# Patient Record
Sex: Male | Born: 1965 | Race: Black or African American | Hispanic: No | Marital: Single | Smoking: Current every day smoker
Health system: Southern US, Community
[De-identification: ages and names within clinical notes are randomized; demographics above are authoritative.]

## PROBLEM LIST (undated history)

## (undated) DIAGNOSIS — J45909 Unspecified asthma, uncomplicated: Secondary | ICD-10-CM

## (undated) DIAGNOSIS — I10 Essential (primary) hypertension: Secondary | ICD-10-CM

---

## 2011-01-30 ENCOUNTER — Emergency Department (HOSPITAL_COMMUNITY)
Admission: EM | Admit: 2011-01-30 | Discharge: 2011-01-31 | Discharge status: Against Medical Advice | Payer: Self-pay | Attending: Emergency Medicine | Admitting: Emergency Medicine

## 2011-01-30 DIAGNOSIS — M545 Low back pain, unspecified: Secondary | ICD-10-CM | POA: Insufficient documentation

## 2011-01-30 DIAGNOSIS — M79609 Pain in unspecified limb: Secondary | ICD-10-CM | POA: Insufficient documentation

## 2011-02-15 ENCOUNTER — Observation Stay (HOSPITAL_COMMUNITY)
Admission: EM | Admit: 2011-02-15 | Discharge: 2011-02-16 | Disposition: A | Discharge status: Home / Self Care | Payer: Self-pay | Attending: General Surgery | Admitting: General Surgery

## 2011-02-15 ENCOUNTER — Emergency Department (HOSPITAL_COMMUNITY): Payer: Self-pay

## 2011-02-15 DIAGNOSIS — Z23 Encounter for immunization: Secondary | ICD-10-CM | POA: Insufficient documentation

## 2011-02-15 DIAGNOSIS — F17200 Nicotine dependence, unspecified, uncomplicated: Secondary | ICD-10-CM | POA: Insufficient documentation

## 2011-02-15 DIAGNOSIS — I10 Essential (primary) hypertension: Secondary | ICD-10-CM | POA: Insufficient documentation

## 2011-02-15 DIAGNOSIS — L089 Local infection of the skin and subcutaneous tissue, unspecified: Secondary | ICD-10-CM | POA: Insufficient documentation

## 2011-02-15 DIAGNOSIS — M65839 Other synovitis and tenosynovitis, unspecified forearm: Principal | ICD-10-CM | POA: Insufficient documentation

## 2011-02-15 DIAGNOSIS — Y998 Other external cause status: Secondary | ICD-10-CM | POA: Insufficient documentation

## 2011-02-15 DIAGNOSIS — M65849 Other synovitis and tenosynovitis, unspecified hand: Secondary | ICD-10-CM | POA: Insufficient documentation

## 2011-02-15 DIAGNOSIS — E119 Type 2 diabetes mellitus without complications: Secondary | ICD-10-CM | POA: Insufficient documentation

## 2011-02-15 DIAGNOSIS — W268XXA Contact with other sharp object(s), not elsewhere classified, initial encounter: Secondary | ICD-10-CM | POA: Insufficient documentation

## 2011-02-15 DIAGNOSIS — F1210 Cannabis abuse, uncomplicated: Secondary | ICD-10-CM | POA: Insufficient documentation

## 2011-02-15 LAB — COMPREHENSIVE METABOLIC PANEL
ALT: 16 U/L (ref 0–53)
AST: 19 U/L (ref 0–37)
Albumin: 3.7 g/dL (ref 3.5–5.2)
Alkaline Phosphatase: 102 U/L (ref 39–117)
BUN: 11 mg/dL (ref 6–23)
CO2: 25 mEq/L (ref 19–32)
Calcium: 8.8 mg/dL (ref 8.4–10.5)
Chloride: 112 mEq/L (ref 96–112)
Creatinine, Ser: 0.91 mg/dL (ref 0.4–1.5)
GFR calc Af Amer: >60 mL/min (ref >60)
GFR calc non Af Amer: >60 mL/min (ref >60)
Glucose, Bld: 166 mg/dL — ABNORMAL HIGH (ref 70–99)
Potassium: 3.2 mEq/L — ABNORMAL LOW (ref 3.5–5.1)
Sodium: 142 mEq/L (ref 135–145)
Total Bilirubin: 0.9 mg/dL (ref 0.3–1.2)
Total Protein: 6.4 g/dL (ref 6.0–8.3)

## 2011-02-15 LAB — DIFFERENTIAL
Basophils Absolute: 0 10*3/uL (ref 0.0–0.1)
Basophils Relative: 0 % (ref 0–1)
Eosinophils Absolute: 0.1 10*3/uL (ref 0.0–0.7)
Eosinophils Relative: 1 % (ref 0–5)
Lymphocytes Relative: 30 % (ref 12–46)
Lymphs Abs: 3.5 10*3/uL (ref 0.7–4.0)
Monocytes Absolute: 0.8 10*3/uL (ref 0.1–1.0)
Monocytes Relative: 7 % (ref 3–12)
Neutro Abs: 7.4 10*3/uL (ref 1.7–7.7)
Neutrophils Relative %: 62 % (ref 43–77)

## 2011-02-15 LAB — CBC
HCT: 39 % (ref 39.0–52.0)
Hemoglobin: 14.2 g/dL (ref 13.0–17.0)
MCH: 31.6 pg (ref 26.0–34.0)
MCHC: 36.4 g/dL — ABNORMAL HIGH (ref 30.0–36.0)
MCV: 86.7 fL (ref 78.0–100.0)
Platelets: 255 10*3/uL (ref 150–400)
RBC: 4.5 MIL/uL (ref 4.22–5.81)
RDW: 13.6 % (ref 11.5–15.5)
WBC: 11.8 10*3/uL — ABNORMAL HIGH (ref 4.0–10.5)

## 2011-02-18 LAB — WOUND CULTURE
Culture: NO GROWTH
Gram Stain: NONE SEEN

## 2011-02-23 NOTE — Op Note (Signed)
NAME:  Daniel Briggs, Daniel Briggs NO.:  [id number]  MEDICAL RECORD NO.:  [id number]           PATIENT TYPE:  LOCATION:                                 FACILITY:  PHYSICIAN:  Johnette Abraham, MD    DATE OF BIRTH:  03-Jun-1966  DATE OF PROCEDURE:  02/16/2011 DATE OF DISCHARGE:                              OPERATIVE REPORT   PREOPERATIVE DIAGNOSIS:  Infected left index finger.  POSTOPERATIVE DIAGNOSIS:  Infected left index finger.  PROCEDURE:  Incision and drainage of complex infection of the left index finger, drainage of the tendon sheath, release of the A1 pulley.  INDICATIONS:  Daniel Briggs is a 45 year old black male who comes to the emergency room with a red, swollen, painful left index finger.  He states that approximately a week he got a splinter in his finger.  He proceeded to remove the splinter with his pocket knife, and over the past week it has become increasingly painful, swollen, signs and symptoms of infection.  He presented to the emergency room.  On evaluation, he had a red, hot, swollen finger with obvious purulence underneath the skin on the ulnar aspect of the index finger with evidence of tenosynovitis.  Risks, benefits, and alternatives of surgery were thoroughly discussed with this gentleman including loss of the finger and further spread of the infection into the hand.  Consent was obtained for surgery.  PROCEDURE:  The patient was taken to the operating room, placed supine on the operating room table.  Preoperative antibiotics were given in the emergency room.  A time-out was performed.  General anesthesia was administered without difficulty.  The left upper extremity was prepped and draped in the normal sterile fashion.  The arm was elevated for approximately a minute and then the tourniquet was inflated to 250 mmHg. Initially, a transverse incision was made over the A1 pulley of the index finger.  Dissection was carried down to the A1 pulley  and tendon. Tendon sheath was opened.  There was not any gross purulence from the tendon sheath, just a synovial-type fluid.  This was sent for culture. An incision was made over the distal insertion of the flexor tendon of the index finger.  Again, the tendon sheath was opened.  There was no gross purulence.  A 22-gauge Angiocath was used in a proximal to distal fashion and the sheath was irrigated with saline solution.  Afterwards, each of these incisions was partially closed and a small piece of Iodoform gauze was packed in these incisions.  Following the area of fluctuance and purulence was addressed, incision overlying this area was performed and immediately a large amount of pus was obtained.  Nonviable skin was removed from the surrounding area revealing a 0.5-cm sized hole on the ulnar aspect of his finger.  Thorough irrigation was performed. The incision tracked up underneath the skin back to the webspace.  This was irrigated with approximately a liter of normal saline solution. Afterwards, the tourniquet was released.  Hemostasis was achieved with direct pressure.  This wound was then packed with quarter- inch Iodoform gauze.  Xeroform was placed over all the  wounds and a sterile dressing.  Marcaine 0.5% was used in the tendon sheath incision and proximally for a median nerve block for comfort.  The patient will have wound care to be performed.  He will be placed on antibiotics and this infection followed carefully.     Johnette Abraham, MD     HCC/MEDQ  D:  02/16/2011  T:  02/16/2011  Job:  [id number]  Electronically Signed by Knute Neu MD on 02/23/2011 10:56:54 PM

## 2011-02-27 NOTE — Discharge Summary (Signed)
  NAME:  Daniel Briggs, Daniel Briggs NO.:  [id number]  MEDICAL RECORD NO.:  [id number]           PATIENT TYPE:  O  LOCATION:  5005                         FACILITY:  [facility]  PHYSICIAN:  Johnette Abraham, MD    DATE OF BIRTH:  Aug 28, 1966  DATE OF ADMISSION:  02/15/2011 DATE OF DISCHARGE:  02/16/2011                              DISCHARGE SUMMARY   ADMITTING DIAGNOSIS:  Infected left index finger.  POSTOPERATIVE DIAGNOSIS:  Infected left index finger.  PROCEDURES PERFORMED:  Incision and drainage of complex infection of the left index finger, drainage of the tendon sheath, and release of the A1 pulley.  Please see operative note for details.  HOSPITAL COURSE:  This patient was seen in the emergency room and felt that he needed surgery.  Consent was obtained.  Surgery was performed. The patient was sent home postsurgery.  INSTRUCTIONS:  He is to remove his packing on postoperative day #1.  He is to irrigate the wounds with normal saline solution and recover them. He has been placed on antibiotics and given a prescription for pain medicines.  His diet is as tolerated.  His activities is just to keep the finger clean and covered.  I will see him back in my office next week.     Johnette Abraham, MD     HCC/MEDQ  D:  02/26/2011  T:  02/26/2011  Job:  [id number]  Electronically Signed by Knute Neu MD on 02/27/2011 08:44:41 AM

## 2011-03-21 ENCOUNTER — Emergency Department (HOSPITAL_COMMUNITY): Payer: Self-pay

## 2011-03-21 ENCOUNTER — Encounter (HOSPITAL_COMMUNITY): Payer: Self-pay | Admitting: Radiology

## 2011-03-21 ENCOUNTER — Emergency Department (HOSPITAL_COMMUNITY)
Admission: EM | Admit: 2011-03-21 | Discharge: 2011-03-21 | Disposition: A | Discharge status: Home / Self Care | Payer: Self-pay | Attending: Emergency Medicine | Admitting: Emergency Medicine

## 2011-03-21 DIAGNOSIS — E119 Type 2 diabetes mellitus without complications: Secondary | ICD-10-CM | POA: Insufficient documentation

## 2011-03-21 DIAGNOSIS — I10 Essential (primary) hypertension: Secondary | ICD-10-CM | POA: Insufficient documentation

## 2011-03-21 DIAGNOSIS — K5900 Constipation, unspecified: Secondary | ICD-10-CM | POA: Insufficient documentation

## 2011-03-21 DIAGNOSIS — N390 Urinary tract infection, site not specified: Secondary | ICD-10-CM | POA: Insufficient documentation

## 2011-03-21 DIAGNOSIS — R109 Unspecified abdominal pain: Secondary | ICD-10-CM | POA: Insufficient documentation

## 2011-03-21 HISTORY — DX: Essential (primary) hypertension: I10

## 2011-03-21 LAB — COMPREHENSIVE METABOLIC PANEL
ALT: 12 U/L (ref 0–53)
AST: 13 U/L (ref 0–37)
Albumin: 3.7 g/dL (ref 3.5–5.2)
Alkaline Phosphatase: 112 U/L (ref 39–117)
BUN: 8 mg/dL (ref 6–23)
CO2: 29 mEq/L (ref 19–32)
Calcium: 9.3 mg/dL (ref 8.4–10.5)
Chloride: 106 mEq/L (ref 96–112)
Creatinine, Ser: 1.04 mg/dL (ref 0.4–1.5)
GFR calc Af Amer: >60 mL/min (ref >60)
GFR calc non Af Amer: >60 mL/min (ref >60)
Glucose, Bld: 110 mg/dL — ABNORMAL HIGH (ref 70–99)
Potassium: 3.3 mEq/L — ABNORMAL LOW (ref 3.5–5.1)
Sodium: 142 mEq/L (ref 135–145)
Total Bilirubin: 0.6 mg/dL (ref 0.3–1.2)
Total Protein: 6.6 g/dL (ref 6.0–8.3)

## 2011-03-21 LAB — URINALYSIS, ROUTINE W REFLEX MICROSCOPIC
Bilirubin Urine: NEGATIVE
Glucose, UA: NEGATIVE mg/dL
Ketones, ur: NEGATIVE mg/dL
Nitrite: NEGATIVE
Protein, ur: NEGATIVE mg/dL
Specific Gravity, Urine: 1.005 (ref 1.005–1.030)
Urobilinogen, UA: 0.2 mg/dL (ref 0.0–1.0)
pH: 6.5 (ref 5.0–8.0)

## 2011-03-21 LAB — ABO/RH: ABO/RH(D): B POS

## 2011-03-21 LAB — DIFFERENTIAL
Basophils Absolute: 0 10*3/uL (ref 0.0–0.1)
Basophils Relative: 0 % (ref 0–1)
Eosinophils Absolute: 0 10*3/uL (ref 0.0–0.7)
Eosinophils Relative: 0 % (ref 0–5)
Lymphocytes Relative: 21 % (ref 12–46)
Lymphs Abs: 2.9 10*3/uL (ref 0.7–4.0)
Monocytes Absolute: 1.1 10*3/uL — ABNORMAL HIGH (ref 0.1–1.0)
Monocytes Relative: 8 % (ref 3–12)
Neutro Abs: 9.6 10*3/uL — ABNORMAL HIGH (ref 1.7–7.7)
Neutrophils Relative %: 71 % (ref 43–77)

## 2011-03-21 LAB — URINE MICROSCOPIC-ADD ON

## 2011-03-21 LAB — TYPE AND SCREEN
ABO/RH(D): B POS
Antibody Screen: NEGATIVE

## 2011-03-21 LAB — CBC
HCT: 39.1 % (ref 39.0–52.0)
Hemoglobin: 14.6 g/dL (ref 13.0–17.0)
MCH: 32.4 pg (ref 26.0–34.0)
MCHC: 37.3 g/dL — ABNORMAL HIGH (ref 30.0–36.0)
MCV: 86.7 fL (ref 78.0–100.0)
Platelets: 199 10*3/uL (ref 150–400)
RBC: 4.51 MIL/uL (ref 4.22–5.81)
RDW: 13.6 % (ref 11.5–15.5)
WBC: 13.6 10*3/uL — ABNORMAL HIGH (ref 4.0–10.5)

## 2011-03-21 LAB — APTT: aPTT: 30 seconds (ref 24–37)

## 2011-03-21 LAB — PROTIME-INR
INR: 1.09 (ref 0.00–1.49)
Prothrombin Time: 14.3 seconds (ref 11.6–15.2)

## 2011-03-21 LAB — LIPASE, BLOOD: Lipase: 24 U/L (ref 11–59)

## 2011-03-21 MED ORDER — IOHEXOL 300 MG/ML  SOLN: 100.0000 mL | Freq: Once | INTRAMUSCULAR | Status: DC | PRN

## 2011-03-22 LAB — URINE CULTURE
Colony Count: 6000
Culture  Setup Time: 201205161005

## 2011-10-20 ENCOUNTER — Encounter (HOSPITAL_COMMUNITY): Payer: Self-pay

## 2011-10-20 ENCOUNTER — Emergency Department (HOSPITAL_COMMUNITY)
Admission: EM | Admit: 2011-10-20 | Discharge: 2011-10-20 | Disposition: A | Discharge status: Home / Self Care | Payer: Self-pay | Attending: Emergency Medicine | Admitting: Emergency Medicine

## 2011-10-20 DIAGNOSIS — S61209A Unspecified open wound of unspecified finger without damage to nail, initial encounter: Secondary | ICD-10-CM | POA: Insufficient documentation

## 2011-10-20 DIAGNOSIS — W260XXA Contact with knife, initial encounter: Secondary | ICD-10-CM | POA: Insufficient documentation

## 2011-10-20 DIAGNOSIS — I10 Essential (primary) hypertension: Secondary | ICD-10-CM | POA: Insufficient documentation

## 2011-10-20 DIAGNOSIS — W261XXA Contact with sword or dagger, initial encounter: Secondary | ICD-10-CM | POA: Insufficient documentation

## 2011-10-20 DIAGNOSIS — S61019A Laceration without foreign body of unspecified thumb without damage to nail, initial encounter: Secondary | ICD-10-CM

## 2011-10-20 MED ORDER — HYDROCODONE-ACETAMINOPHEN 5-325 MG PO TABS: 2.0000 | ORAL_TABLET | ORAL | Status: AC | PRN

## 2011-10-20 NOTE — ED Notes (Signed)
Pt. Sliced his rt. Thumb tip with a knife and also his 3rd middle finger.

## 2011-10-20 NOTE — ED Notes (Signed)
MD at bedside. 

## 2011-10-20 NOTE — ED Notes (Signed)
Patients would was cleaned with betadine, ointment was applied and gauze dressing.  Pt tolerated well.

## 2011-10-20 NOTE — ED Provider Notes (Signed)
History     CSN: [id number] Arrival date & time: 10/20/2011 11:07 AM   First MD Initiated Contact with Patient 10/20/11 1119      Chief Complaint  Patient presents with  . Laceration    (Consider location/radiation/quality/duration/timing/severity/associated sxs/prior treatment) HPI Pt. Sliced his rt. Thumb tip with a knife and also his 3rd middle finger.  Patient is up-to-date on his tetanus.  No other complaints.  Past Medical History  Diagnosis Date  . Hypertension     History reviewed. No pertinent past surgical history.  History reviewed. No pertinent family history.  History  Substance Use Topics  . Smoking status: Current Everyday Smoker  . Smokeless tobacco: Not on file  . Alcohol Use: Yes      Review of Systems  All other systems reviewed and are negative.    Allergies  Penicillins  Home Medications  No current outpatient prescriptions on file.  BP 173/103  Pulse 79  Temp(Src) 98.2 F (36.8 C) (Oral)  Resp 20  Ht 5\' 9"  (1.753 m)  Wt 217 lb (98.431 kg)  BMI 32.05 kg/m2  SpO2 97%  Physical Exam  Nursing note and vitals reviewed. Constitutional: He is oriented to person, place, and time. He appears well-developed and well-nourished. No distress.  HENT:  Head: Normocephalic and atraumatic.  Eyes: Pupils are equal, round, and reactive to light.  Neck: Normal range of motion.  Cardiovascular: Normal rate and intact distal pulses.   Pulmonary/Chest: No respiratory distress.  Abdominal: Normal appearance. He exhibits no distension.  Musculoskeletal: Normal range of motion.       Hands: Neurological: He is alert and oriented to person, place, and time. No cranial nerve deficit.  Skin: Skin is warm and dry. No rash noted.  Psychiatric: He has a normal mood and affect. His behavior is normal.    ED Course  Procedures (including critical care time)  Labs Reviewed - No data to display No results found.   1. Thumb laceration       MDM            Nelia Shi, MD 10/20/11 1136

## 2012-05-15 ENCOUNTER — Encounter (HOSPITAL_COMMUNITY): Payer: Self-pay | Admitting: *Deleted

## 2012-05-15 ENCOUNTER — Emergency Department (HOSPITAL_COMMUNITY): Payer: Self-pay

## 2012-05-15 ENCOUNTER — Emergency Department (HOSPITAL_COMMUNITY)
Admission: EM | Admit: 2012-05-15 | Discharge: 2012-05-16 | Disposition: A | Discharge status: Home / Self Care | Payer: Self-pay | Attending: Emergency Medicine | Admitting: Emergency Medicine

## 2012-05-15 DIAGNOSIS — R319 Hematuria, unspecified: Secondary | ICD-10-CM | POA: Insufficient documentation

## 2012-05-15 DIAGNOSIS — N390 Urinary tract infection, site not specified: Secondary | ICD-10-CM | POA: Insufficient documentation

## 2012-05-15 DIAGNOSIS — F17200 Nicotine dependence, unspecified, uncomplicated: Secondary | ICD-10-CM | POA: Insufficient documentation

## 2012-05-15 DIAGNOSIS — R300 Dysuria: Secondary | ICD-10-CM | POA: Insufficient documentation

## 2012-05-15 DIAGNOSIS — R350 Frequency of micturition: Secondary | ICD-10-CM | POA: Insufficient documentation

## 2012-05-15 DIAGNOSIS — R109 Unspecified abdominal pain: Secondary | ICD-10-CM | POA: Insufficient documentation

## 2012-05-15 DIAGNOSIS — I10 Essential (primary) hypertension: Secondary | ICD-10-CM | POA: Insufficient documentation

## 2012-05-15 DIAGNOSIS — M545 Low back pain, unspecified: Secondary | ICD-10-CM | POA: Insufficient documentation

## 2012-05-15 LAB — URINE MICROSCOPIC-ADD ON

## 2012-05-15 LAB — URINALYSIS, ROUTINE W REFLEX MICROSCOPIC
Bilirubin Urine: NEGATIVE
Glucose, UA: NEGATIVE mg/dL
Ketones, ur: NEGATIVE mg/dL
Nitrite: NEGATIVE
Protein, ur: NEGATIVE mg/dL
Specific Gravity, Urine: 1.024 (ref 1.005–1.030)
Urobilinogen, UA: 1 mg/dL (ref 0.0–1.0)
pH: 7 (ref 5.0–8.0)

## 2012-05-15 LAB — OCCULT BLOOD, POC DEVICE: Fecal Occult Bld: NEGATIVE

## 2012-05-15 MED ORDER — MORPHINE SULFATE 2 MG/ML IJ SOLN
2.0000 mg | Freq: Once | INTRAMUSCULAR | Status: AC
  Administered 2012-05-15: 2 mg via INTRAVENOUS
  Filled 2012-05-15: qty 1

## 2012-05-15 MED ORDER — CIPROFLOXACIN HCL 500 MG PO TABS: 500.0000 mg | ORAL_TABLET | Freq: Two times a day (BID) | ORAL | Status: AC

## 2012-05-15 MED ORDER — KETOROLAC TROMETHAMINE 30 MG/ML IJ SOLN
30.0000 mg | Freq: Once | INTRAMUSCULAR | Status: AC
  Administered 2012-05-15: 30 mg via INTRAVENOUS
  Filled 2012-05-15: qty 1

## 2012-05-15 NOTE — ED Provider Notes (Signed)
History     CSN: [id number]  Arrival date & time 05/15/12  9629   First MD Initiated Contact with Patient 05/15/12 2109      Chief Complaint  Patient presents with  . Flank Pain    R flank pain    (Consider location/radiation/quality/duration/timing/severity/associated sxs/prior treatment) Patient is a 46 y.o. male presenting with flank pain. The history is provided by the patient.  Flank Pain This is a new problem. The current episode started in the past 7 days. The problem occurs constantly. The problem has been unchanged. Pertinent negatives include no abdominal pain, chest pain, congestion, coughing, fatigue, fever, headaches, nausea, neck pain, numbness, rash, sore throat, vomiting or weakness. Exacerbated by: urinating. He has tried nothing for the symptoms. The treatment provided no relief.    Past Medical History  Diagnosis Date  . Hypertension     History reviewed. No pertinent past surgical history.  History reviewed. No pertinent family history.  History  Substance Use Topics  . Smoking status: Current Everyday Smoker  . Smokeless tobacco: Not on file  . Alcohol Use: Yes      Review of Systems  Constitutional: Negative for fever, activity change, appetite change and fatigue.  HENT: Negative for congestion, sore throat, facial swelling, rhinorrhea, trouble swallowing, neck pain, neck stiffness, voice change and sinus pressure.   Eyes: Negative.   Respiratory: Negative for cough, choking, chest tightness, shortness of breath and wheezing.   Cardiovascular: Negative for chest pain.  Gastrointestinal: Negative for nausea, vomiting and abdominal pain.  Genitourinary: Positive for frequency and flank pain. Negative for dysuria, urgency, hematuria and difficulty urinating.  Musculoskeletal: Negative for back pain and gait problem.  Skin: Negative for rash and wound.  Neurological: Negative for facial asymmetry, weakness, numbness and headaches.    Psychiatric/Behavioral: Negative for behavioral problems, confusion and agitation. The patient is not nervous/anxious and is not hyperactive.   All other systems reviewed and are negative.    Allergies  Penicillins  Home Medications  No current outpatient prescriptions on file.  BP 168/102  Pulse 64  Temp 98.7 F (37.1 C) (Oral)  Resp 14  SpO2 96%  Physical Exam  Nursing note and vitals reviewed. Constitutional: He is oriented to person, place, and time. He appears well-developed and well-nourished. No distress.  HENT:  Head: Normocephalic and atraumatic.  Right Ear: External ear normal.  Left Ear: External ear normal.  Mouth/Throat: No oropharyngeal exudate.  Eyes: Conjunctivae and EOM are normal. Pupils are equal, round, and reactive to light. Right eye exhibits no discharge. Left eye exhibits no discharge.  Neck: Normal range of motion. Neck supple. No JVD present. No tracheal deviation present. No thyromegaly present.  Cardiovascular: Normal rate, regular rhythm, normal heart sounds and intact distal pulses.  Exam reveals no gallop and no friction rub.   No murmur heard. Pulmonary/Chest: Effort normal and breath sounds normal. No respiratory distress. He has no wheezes. He exhibits no tenderness.  Abdominal: Soft. Bowel sounds are normal. He exhibits no distension. There is no tenderness. There is CVA tenderness. There is no rebound and no guarding.  Genitourinary: Rectum normal and prostate normal. Guaiac negative stool. No penile tenderness.  Musculoskeletal: Normal range of motion. He exhibits no edema and no tenderness.  Lymphadenopathy:    He has no cervical adenopathy.  Neurological: He is alert and oriented to person, place, and time. No cranial nerve deficit.  Skin: Skin is warm and dry. No rash noted. He is not diaphoretic. No pallor.  Psychiatric: He has a normal mood and affect. His behavior is normal.    ED Course  Procedures (including critical care  time)  Labs Reviewed  URINALYSIS, ROUTINE W REFLEX MICROSCOPIC - Abnormal; Notable for the following:    Hgb urine dipstick TRACE (*)     Leukocytes, UA LARGE (*)     All other components within normal limits  URINE MICROSCOPIC-ADD ON - Abnormal; Notable for the following:    Squamous Epithelial / LPF FEW (*)     All other components within normal limits  OCCULT BLOOD, POC DEVICE   No results found.   No diagnosis found.    MDM  46 year old male patient with past medical history of pyelonephritis and cystitis presents with right flank pain. Patient says the pain has been there for one week and similar to previous urinary tract infection symptoms. Pain is in the right flank L. right lower lumbar region. Worse with straight leg raise. Worse with palpation. Patient with urinary frequency dysuria. Patient with benign abdominal exam. Patient with normal feeling prostate no bogginess no pain with prostate palpation Hemoccult-negative. Patient likely has urinary tract infection followed by this. There is some blood in the patient's urine so there could be concern for stone patient will have noncontrasted CT scan to evaluate  Results for orders placed during the hospital encounter of 05/15/12  URINALYSIS, ROUTINE W REFLEX MICROSCOPIC      Component Value Range   Color, Urine YELLOW  YELLOW   APPearance CLEAR  CLEAR   Specific Gravity, Urine 1.024  1.005 - 1.030   pH 7.0  5.0 - 8.0   Glucose, UA NEGATIVE  NEGATIVE mg/dL   Hgb urine dipstick TRACE (*) NEGATIVE   Bilirubin Urine NEGATIVE  NEGATIVE   Ketones, ur NEGATIVE  NEGATIVE mg/dL   Protein, ur NEGATIVE  NEGATIVE mg/dL   Urobilinogen, UA 1.0  0.0 - 1.0 mg/dL   Nitrite NEGATIVE  NEGATIVE   Leukocytes, UA LARGE (*) NEGATIVE  URINE MICROSCOPIC-ADD ON      Component Value Range   Squamous Epithelial / LPF FEW (*) RARE   WBC, UA 21-50  <3 WBC/hpf   RBC / HPF 0-2  <3 RBC/hpf   Bacteria, UA RARE  RARE  OCCULT BLOOD, POC DEVICE       Component Value Range   Fecal Occult Bld NEGATIVE     Labs consistent with urinary tract infection. Patient will be given 2 weeks of Cipro. The patient is stable for discharge at this time.  Case discussed with Dr. Lyndee Leo, MD 05/16/12 [phone]

## 2012-05-15 NOTE — ED Notes (Signed)
Pt with hx of UTI to ED c/o the same pain - R flank pain and urge to urinate.

## 2012-05-16 NOTE — ED Provider Notes (Signed)
I saw and evaluated the patient, reviewed the resident's note and I agree with the findings and plan.  1 week of constat R lower back pain with urinary symptoms. Denies trauma. No focal weakness, numbness, tingling, incontinence. +2 DP and PT pulses, great toe dorsiflexion intact bilatereally. Prostate and testicles normal on resident's exam.  Glynn Octave, MD 05/16/12 1024

## 2012-05-18 LAB — URINE CULTURE: Colony Count: 15000

## 2012-08-07 ENCOUNTER — Emergency Department (HOSPITAL_COMMUNITY)
Admission: EM | Admit: 2012-08-07 | Discharge: 2012-08-07 | Disposition: A | Discharge status: Home / Self Care | Payer: Self-pay | Attending: Emergency Medicine | Admitting: Emergency Medicine

## 2012-08-07 ENCOUNTER — Encounter (HOSPITAL_COMMUNITY): Payer: Self-pay | Admitting: Emergency Medicine

## 2012-08-07 DIAGNOSIS — F17200 Nicotine dependence, unspecified, uncomplicated: Secondary | ICD-10-CM | POA: Insufficient documentation

## 2012-08-07 DIAGNOSIS — K047 Periapical abscess without sinus: Secondary | ICD-10-CM | POA: Insufficient documentation

## 2012-08-07 DIAGNOSIS — I10 Essential (primary) hypertension: Secondary | ICD-10-CM | POA: Insufficient documentation

## 2012-08-07 DIAGNOSIS — Z880 Allergy status to penicillin: Secondary | ICD-10-CM | POA: Insufficient documentation

## 2012-08-07 DIAGNOSIS — K029 Dental caries, unspecified: Secondary | ICD-10-CM | POA: Insufficient documentation

## 2012-08-07 MED ORDER — CLINDAMYCIN HCL 150 MG PO CAPS: 150.0000 mg | ORAL_CAPSULE | Freq: Three times a day (TID) | ORAL | Status: DC

## 2012-08-07 MED ORDER — TRAMADOL HCL 50 MG PO TABS
50.0000 mg | ORAL_TABLET | Freq: Once | ORAL | Status: AC
  Administered 2012-08-07: 50 mg via ORAL
  Filled 2012-08-07: qty 1

## 2012-08-07 MED ORDER — CLINDAMYCIN HCL 150 MG PO CAPS
150.0000 mg | ORAL_CAPSULE | Freq: Once | ORAL | Status: AC
  Administered 2012-08-07: 150 mg via ORAL
  Filled 2012-08-07: qty 1

## 2012-08-07 MED ORDER — TRAMADOL HCL 50 MG PO TABS: 50.0000 mg | ORAL_TABLET | Freq: Four times a day (QID) | ORAL | Status: DC | PRN

## 2012-08-07 NOTE — ED Provider Notes (Signed)
History     CSN: [id number]  Arrival date & time 08/07/12  2155   First MD Initiated Contact with Patient 08/07/12 2203      Chief Complaint  Patient presents with  . Dental Pain    (Consider location/radiation/quality/duration/timing/severity/associated sxs/prior treatment) HPI Comments: Patient has a large cavity in the left upper second molar with no tooth before.  It or after it was, swelling, and tenderness.  For the past 3, weeks.  He is been taking over-the-counter Tylenol without any relief.  He does have a dentist appointment November 1, but states he can't stand the pain until then  Patient is a 46 y.o. male presenting with tooth pain. The history is provided by the patient.  Dental PainThe primary symptoms include mouth pain. Primary symptoms do not include dental injury or sore throat.    Past Medical History  Diagnosis Date  . Hypertension     History reviewed. No pertinent past surgical history.  History reviewed. No pertinent family history.  History  Substance Use Topics  . Smoking status: Current Every Day Smoker  . Smokeless tobacco: Not on file  . Alcohol Use: Yes      Review of Systems  Constitutional: Negative for chills and appetite change.  HENT: Positive for dental problem. Negative for sore throat and mouth sores.   Neurological: Negative for dizziness.    Allergies  Penicillins  Home Medications   Current Outpatient Rx  Name Route Sig Dispense Refill  . ACETAMINOPHEN 500 MG PO TABS Oral Take 500 mg by mouth every 6 (six) hours as needed. For pain    . BC HEADACHE POWDER PO Oral Take 1 Package by mouth daily as needed. For tooth pain per patient    . CLINDAMYCIN HCL 150 MG PO CAPS Oral Take 1 capsule (150 mg total) by mouth 3 (three) times daily. 30 capsule 0  . TRAMADOL HCL 50 MG PO TABS Oral Take 1 tablet (50 mg total) by mouth every 6 (six) hours as needed for pain. 16 tablet 0    BP 161/108  Pulse 82  Temp 98.5 F (36.9 C)  (Oral)  Resp 18  SpO2 97%  Physical Exam  Constitutional: He is oriented to person, place, and time. He appears well-developed and well-nourished.  HENT:  Head: Normocephalic.  Eyes: Pupils are equal, round, and reactive to light.  Neck: Normal range of motion.  Cardiovascular: Normal rate.   Pulmonary/Chest: Effort normal.  Musculoskeletal: Normal range of motion.  Neurological: He is alert and oriented to person, place, and time.  Skin: Skin is warm.    ED Course  Procedures (including critical care time)  Labs Reviewed - No data to display No results found.   1. Dental caries   2. Dental abscess       MDM  We'll treat with antibiotic, and pain control.  Encouraged patient to call the dentist on a daily basis to see if there is an earlier appointment to have his cavity filled         Arman Filter, NP 08/07/12 2234

## 2012-08-07 NOTE — ED Provider Notes (Signed)
Medical screening examination/treatment/procedure(s) were performed by non-physician practitioner and as supervising physician I was immediately available for consultation/collaboration.   David H Yao, MD 08/07/12 2326 

## 2012-08-07 NOTE — ED Notes (Signed)
Patient reports that he has a whole in one of his teeth; reports dental pain on left side.  Patient reports that the pain started a few days ago and he has a dentist appointment on the second of November -- patient reports that the pain is too severe to wait.

## 2012-11-09 ENCOUNTER — Encounter (HOSPITAL_COMMUNITY): Payer: Self-pay | Admitting: *Deleted

## 2012-11-09 ENCOUNTER — Emergency Department (HOSPITAL_COMMUNITY)
Admission: EM | Admit: 2012-11-09 | Discharge: 2012-11-09 | Disposition: A | Discharge status: Home / Self Care | Payer: Self-pay | Attending: Emergency Medicine | Admitting: Emergency Medicine

## 2012-11-09 DIAGNOSIS — I10 Essential (primary) hypertension: Secondary | ICD-10-CM | POA: Insufficient documentation

## 2012-11-09 DIAGNOSIS — L03211 Cellulitis of face: Secondary | ICD-10-CM | POA: Insufficient documentation

## 2012-11-09 DIAGNOSIS — F17200 Nicotine dependence, unspecified, uncomplicated: Secondary | ICD-10-CM | POA: Insufficient documentation

## 2012-11-09 DIAGNOSIS — L0201 Cutaneous abscess of face: Secondary | ICD-10-CM | POA: Insufficient documentation

## 2012-11-09 MED ORDER — SULFAMETHOXAZOLE-TRIMETHOPRIM 800-160 MG PO TABS: 2.0000 | ORAL_TABLET | Freq: Two times a day (BID) | ORAL | Status: DC

## 2012-11-09 NOTE — ED Provider Notes (Signed)
History     CSN: [id number]  Arrival date & time 11/09/12  1420   First MD Initiated Contact with Patient 11/09/12 [phone]      Chief Complaint  Patient presents with  . Facial Swelling    (Consider location/radiation/quality/duration/timing/severity/associated sxs/prior treatment) HPI Patient presents to the emergency department with swelling to the left side of his face in the area just anterior to his ear.  Patient also has a smaller area along the jaw line, more towards the mid part of his jaw.  Patient denies fevers, nausea, vomiting, blurred vision, headache, chest pain, shortness of breath, lethargy, or weakness.  Patient states that the area opened up and drained some material, but still swollen and painful.  Patient denies any treatment prior to arrival.  History reviewed. No pertinent past surgical history.  No family history on file.  History  Substance Use Topics  . Smoking status: Current Every Day Smoker  . Smokeless tobacco: Not on file  . Alcohol Use: Yes      Review of Systems All other systems negative except as documented in the HPI. All pertinent positives and negatives as reviewed in the HPI.  Allergies  Penicillins  Home Medications   Current Outpatient Rx  Name  Route  Sig  Dispense  Refill  . TRAMADOL HCL 50 MG PO TABS   Oral   Take 50 mg by mouth every 6 (six) hours as needed. For pain           BP 131/86  Pulse 80  Temp 98.8 F (37.1 C) (Oral)  Resp 20  SpO2 96%  Physical Exam  Nursing note and vitals reviewed. HENT:  Head: Normocephalic and atraumatic.    Cardiovascular: Normal rate, regular rhythm and normal heart sounds.  Exam reveals no gallop and no friction rub.   No murmur heard. Pulmonary/Chest: Effort normal and breath sounds normal. No respiratory distress.    ED Course  Procedures (including critical care time)  The patient was seen by Dr. Preston Fleeting and he feel needs to be seen by ENT. The patient will be placed on  antibiotics and told to use heat on the area. Patient is advised to return here as needed.  MDM          Carlyle Dolly, PA-C 11/09/12 [phone]

## 2012-11-09 NOTE — ED Notes (Signed)
Pt is here with facial swelling and states it is from his eye to his jaw on the left side.

## 2012-11-09 NOTE — ED Provider Notes (Signed)
47 year old male comes in with 2 areas of swelling of the left side of his face. There is a small area on the left jaw measuring about 1 cm and larger area in the peritoneal or area measuring approximately 3 cm. Both have the appearance and texture of abscesses. He'll be referred to ENT for incision and drainage and started on antibiotics.  Medical screening examination/treatment/procedure(s) were conducted as a shared visit with non-physician practitioner(s) and myself.  I personally evaluated the patient during the encounter   Dione Booze, MD 11/09/12 1535

## 2015-10-20 ENCOUNTER — Emergency Department (HOSPITAL_COMMUNITY): Payer: Self-pay

## 2015-10-20 ENCOUNTER — Emergency Department (HOSPITAL_COMMUNITY)
Admission: EM | Admit: 2015-10-20 | Discharge: 2015-10-20 | Disposition: A | Discharge status: Home / Self Care | Payer: Self-pay | Attending: Emergency Medicine | Admitting: Emergency Medicine

## 2015-10-20 ENCOUNTER — Encounter (HOSPITAL_COMMUNITY): Payer: Self-pay | Admitting: Neurology

## 2015-10-20 DIAGNOSIS — J209 Acute bronchitis, unspecified: Secondary | ICD-10-CM | POA: Insufficient documentation

## 2015-10-20 DIAGNOSIS — Z880 Allergy status to penicillin: Secondary | ICD-10-CM | POA: Insufficient documentation

## 2015-10-20 DIAGNOSIS — I10 Essential (primary) hypertension: Secondary | ICD-10-CM | POA: Insufficient documentation

## 2015-10-20 DIAGNOSIS — F17200 Nicotine dependence, unspecified, uncomplicated: Secondary | ICD-10-CM | POA: Insufficient documentation

## 2015-10-20 DIAGNOSIS — F1210 Cannabis abuse, uncomplicated: Secondary | ICD-10-CM | POA: Insufficient documentation

## 2015-10-20 LAB — CBC
HCT: 44.3 % (ref 39.0–52.0)
Hemoglobin: 16 g/dL (ref 13.0–17.0)
MCH: 31.7 pg (ref 26.0–34.0)
MCHC: 36.1 g/dL — ABNORMAL HIGH (ref 30.0–36.0)
MCV: 87.9 fL (ref 78.0–100.0)
Platelets: 232 10*3/uL (ref 150–400)
RBC: 5.04 MIL/uL (ref 4.22–5.81)
RDW: 13.9 % (ref 11.5–15.5)
WBC: 8.8 10*3/uL (ref 4.0–10.5)

## 2015-10-20 LAB — COMPREHENSIVE METABOLIC PANEL
ALT: 18 U/L (ref 17–63)
AST: 19 U/L (ref 15–41)
Albumin: 4.1 g/dL (ref 3.5–5.0)
Alkaline Phosphatase: 102 U/L (ref 38–126)
Anion gap: 6 (ref 5–15)
BUN: 11 mg/dL (ref 6–20)
CO2: 29 mmol/L (ref 22–32)
Calcium: 9.1 mg/dL (ref 8.9–10.3)
Chloride: 106 mmol/L (ref 101–111)
Creatinine, Ser: 1.21 mg/dL (ref 0.61–1.24)
GFR calc Af Amer: >60 mL/min (ref >60)
GFR calc non Af Amer: >60 mL/min (ref >60)
Glucose, Bld: 85 mg/dL (ref 65–99)
Potassium: 3.5 mmol/L (ref 3.5–5.1)
Sodium: 141 mmol/L (ref 135–145)
Total Bilirubin: 0.7 mg/dL (ref 0.3–1.2)
Total Protein: 6.9 g/dL (ref 6.5–8.1)

## 2015-10-20 LAB — RAPID URINE DRUG SCREEN, HOSP PERFORMED
Amphetamines: NOT DETECTED
Barbiturates: NOT DETECTED
Benzodiazepines: NOT DETECTED
Cocaine: NOT DETECTED
Opiates: NOT DETECTED
Tetrahydrocannabinol: POSITIVE — AB

## 2015-10-20 LAB — TROPONIN I: Troponin I: <0.03 ng/mL (ref <0.031)

## 2015-10-20 MED ORDER — ALBUTEROL SULFATE HFA 108 (90 BASE) MCG/ACT IN AERS
2.0000 | INHALATION_SPRAY | RESPIRATORY_TRACT | Status: DC | PRN
  Administered 2015-10-20: 2 via RESPIRATORY_TRACT
  Filled 2015-10-20: qty 6.7

## 2015-10-20 MED ORDER — IPRATROPIUM BROMIDE 0.02 % IN SOLN
0.5000 mg | Freq: Once | RESPIRATORY_TRACT | Status: AC
  Administered 2015-10-20: 0.5 mg via RESPIRATORY_TRACT
  Filled 2015-10-20: qty 2.5

## 2015-10-20 MED ORDER — ALBUTEROL SULFATE (2.5 MG/3ML) 0.083% IN NEBU
5.0000 mg | INHALATION_SOLUTION | Freq: Once | RESPIRATORY_TRACT | Status: AC
  Administered 2015-10-20: 5 mg via RESPIRATORY_TRACT
  Filled 2015-10-20: qty 6

## 2015-10-20 MED ORDER — HYDROCHLOROTHIAZIDE 12.5 MG PO TABS: 12.5000 mg | ORAL_TABLET | Freq: Every day | ORAL | Status: AC

## 2015-10-20 NOTE — ED Notes (Signed)
Pt reports cp and sob since yesterday. Left sided cp, is constant. Denies cardiac hx. Has cough.

## 2015-10-20 NOTE — ED Notes (Signed)
Pt verbalized understanding of use of albuterol inhaler and use of BP medication and has no further questions. Pt stable and NAD

## 2015-10-20 NOTE — Discharge Instructions (Signed)
Return to the ED with any concerns including difficulty breathing despite using albuterol every 4 hours, not drinking fluids, decreased urine output, vomiting and not able to keep down liquids or medications, decreased level of alertness/lethargy, or any other alarming symptoms °

## 2015-10-20 NOTE — ED Provider Notes (Signed)
CSN: [id number]     Arrival date & time 10/20/15  1502 History   First MD Initiated Contact with Patient 10/20/15 1611     Chief Complaint  Patient presents with  . Chest Pain  . 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  No chest pain, but feels a pressure in the left side of his chest.  No fainting.  No diaphoresis. No swelling of legs.  No nausea or radiation of pain.  He has hx of hypertension but states he has not been taking his BP meds for at least 4 years.  He does endorse smoking cigarettes.  There are no other associated systemic symptoms, there are no other alleviating or modifying factors.   Past Medical History  Diagnosis Date  . Hypertension    History reviewed. No pertinent past surgical history. No family history on file. Social History  Substance Use Topics  . Smoking status: Current Every Day Smoker  . Smokeless tobacco: None  . Alcohol Use: Yes    Review of Systems  ROS reviewed and all otherwise negative except for mentioned in HPI    Allergies  Penicillins  Home Medications   Prior to Admission medications   Medication Sig Start Date End Date Taking? Authorizing Provider  hydrochlorothiazide (HYDRODIURIL) 12.5 MG tablet Take 1 tablet (12.5 mg total) by mouth daily. 10/20/15   Jerelyn Scott, MD  sulfamethoxazole-trimethoprim (SEPTRA DS) 800-160 MG per tablet Take 2 tablets by mouth 2 (two) times daily. Patient not taking: Reported on 10/20/2015 11/09/12   Charlestine Night, PA-C   BP 164/95 mmHg  Pulse 78  Temp(Src) 98.1 F (36.7 C) (Oral)  Resp 16  SpO2 99%  Vitals reviewed Physical Exam  Physical Examination: General appearance - alert, well appearing, and in no distress Mental status - alert, oriented to person, place, and time Eyes - no conjunctival injection, no  scleral icterus Mouth - mucous membranes moist, pharynx normal without lesions Chest - bilateral coarse expiratory wheezing, normal respiratory effort, BSS Heart - normal rate, regular rhythm, normal S1, S2, no murmurs, rubs, clicks or gallops Abdomen - soft, nontender, nondistended, no masses or organomegaly Neurological - alert, oriented, normal speech Extremities - peripheral pulses normal, no pedal edema, no clubbing or cyanosis Skin - normal coloration and turgor, no rashes  ED Course  Procedures (including critical care time) Labs Review Labs Reviewed  CBC - Abnormal; Notable for the following:    MCHC 36.1 (*)    All other components within normal limits  URINE RAPID DRUG SCREEN, HOSP PERFORMED - Abnormal; Notable for the following:    Tetrahydrocannabinol POSITIVE (*)    All other components within normal limits  COMPREHENSIVE METABOLIC PANEL  TROPONIN I    Imaging Review Dg Chest 2 View  10/20/2015  CLINICAL DATA:  Shortness of breath and left-sided chest pain for 1 day EXAM: CHEST  2 VIEW COMPARISON:  None. FINDINGS: Lungs are clear. Heart size and pulmonary vascularity are normal. No adenopathy. No pneumothorax. No bone lesions. IMPRESSION: No abnormality noted. Electronically Signed   By: Bretta Bang III M.D.   On: 10/20/2015 15:29   I have personally reviewed and evaluated these images and lab results as part of my medical decision-making.   EKG Interpretation   Date/Time:  Thursday October 20 2015 15:06:55 EST Ventricular Rate:  98 PR Interval:  128 QRS Duration: 82 QT  Interval:  358 QTC Calculation: 457 R Axis:   52 Text Interpretation:  Normal sinus rhythm Possible Left atrial enlargement  ST \\T \ T wave abnormality, consider inferior ischemia Abnormal ECG No old  tracing to compare Confirmed by [facility]  MD, MARTHA [phone]([id number]) on 10/20/2015  4:41:52 PM      MDM   Final diagnoses:  Bronchitis with bronchospasm  Essential hypertension    Pt  presenting with c/o chest tightness and shortness of breath.  On exam he has coarse wheezing, symptoms resolved after duoneb in the ED.  He does have hypertension and t wave inversions on EKG- however doubt ACS as he has been having constant chest tightness since yesterday and troponin negative.  Also what he describes as chest tightness resolved after his wheezing resolved.  Pt given rx for HTCZ, albuterol inhaler, advised smoking cessation.  Also given referrral to [facility].  He states he has a PMD in [place] and can see them in the meantime.  Discharged with strict return precautions.  Pt agreeable with plan.    Jerelyn ScottMartha Linker, MD 10/20/15 [phone]

## 2016-10-16 ENCOUNTER — Ambulatory Visit: Payer: Self-pay | Admitting: Physical Therapy

## 2016-10-19 ENCOUNTER — Ambulatory Visit: Payer: BLUE CROSS/BLUE SHIELD | Attending: Family Medicine | Admitting: Physical Therapy

## 2016-10-19 DIAGNOSIS — M62830 Muscle spasm of back: Secondary | ICD-10-CM

## 2016-10-19 DIAGNOSIS — G8929 Other chronic pain: Secondary | ICD-10-CM | POA: Diagnosis present

## 2016-10-19 DIAGNOSIS — M6281 Muscle weakness (generalized): Secondary | ICD-10-CM | POA: Diagnosis present

## 2016-10-19 DIAGNOSIS — M5441 Lumbago with sciatica, right side: Secondary | ICD-10-CM | POA: Diagnosis not present

## 2016-10-19 NOTE — Therapy (Addendum)
[place], [place], [address] Phone: [phone]   Fax:  [phone]  Physical Therapy Evaluation/ Discharge   Patient Details  Name: Daniel Briggs MRN: [id number] Date of Birth: 1965/12/11 Referring Provider: Rachell Cipro MD   Encounter Date: 10/19/2016      PT End of Session - 10/19/16 1202    Visit Number 1   Number of Visits 12   Date for PT Re-Evaluation 11/30/16   Authorization Type [employer]   PT Start Time 0830   PT Stop Time 0918   PT Time Calculation (min) 48 min   Activity Tolerance Patient tolerated treatment well   Behavior During Therapy [facility] for tasks assessed/performed      Past Medical History:  Diagnosis Date  . Hypertension     No past surgical history on file.  There were no vitals filed for this visit.       Subjective Assessment - 10/19/16 0845    Subjective Patient reports lower back pain since last August. He was playing volleyball and the next day he began to have lower back pain that radiated into his right leg. He works in IT trainer. He has had some lower back pain in the past but nothing that lasted or radiated. He has pain with standing and walking.    Pertinent History No significant past histroy    Limitations Standing;Walking   How long can you sit comfortably? No limit but stiffness    How long can you stand comfortably? < 10 minutes    How long can you walk comfortably? < 1000'    Diagnostic tests Nothing taken `   Patient Stated Goals To have less lower back pain    Currently in Pain? Yes   Pain Score 8   8/10    Pain Location Back   Pain Orientation Left   Pain Descriptors / Indicators Aching   Pain Type Acute pain   Pain Radiating Towards raidates into the posterior knee    Pain Onset More than a month ago   Pain Frequency Constant   Aggravating Factors  standing, walking    Pain Relieving Factors rest; sitting    Effect of Pain  on Daily Activities difficulty standing to perfrom work tasks    Multiple Pain Sites No            [facility] PT Assessment - 10/19/16 0001      Assessment   Medical Diagnosis Low back pain with right radicular pain    Referring Provider Rachell Cipro MD    Onset Date/Surgical Date --  August 2017    Hand Dominance Right   Next MD Visit 10/22/2016   Prior Therapy None      Precautions   Precautions None     Restrictions   Weight Bearing Restrictions No     Balance Screen   Has the patient fallen in the past 6 months No     Home Environment   Additional Comments No steps into the house      Prior Function   Level of Independence Independent   Vocation Full time employment   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    Leisure Nothing      Cognition   Overall Cognitive Status Within Functional Limits for tasks assessed   Attention Focused   Focused Attention Appears intact   Memory Appears intact   Awareness Appears intact   Problem Solving Appears intact  Observation/Other Assessments   Focus on Therapeutic Outcomes (FOTO)  Not filled out      Sensation   Additional Comments radicular pain into his postrior knee      Posture/Postural Control   Posture Comments rounded shoulders      ROM / Strength   AROM / PROM / Strength AROM;PROM;Strength     AROM   AROM Assessment Site Lumbar   Lumbar Flexion limited 50% with increased radicualr symptoms    Lumbar Extension limited 50% with pain going down the left leg    Lumbar - Right Side Bend pain in right leg    Lumbar - Left Side Bend pain in right leg    Lumbar - Right Rotation pain in right leg   Lumbar - Left Rotation pain in right leg      PROM   Overall PROM Comments Passive hip flecxion to 85 degrees on the right      Strength   Strength Assessment Site Hip;Knee   Right/Left Hip Right;Left   Right Hip Flexion 4/5   Right Hip Extension 4/5   Right Hip Internal Rotation 4/5    Right Hip ABduction 4/5   Left Hip Flexion 5/5   Left Hip Extension 5/5   Left Hip ABduction 5/5   Left Hip ADduction 5/5   Right/Left Knee Right;Left   Right Knee Flexion 4+/5   Right Knee Extension 5/5   Left Knee Flexion 5/5   Left Knee Extension 5/5     Flexibility   Soft Tissue Assessment /Muscle Length yes   Hamstrings hamstings limited 50 degrees bilateral    Piriformis tight piriformis      Palpation   Spinal mobility limited spial mobility PA L1-L5    Palpation comment tenderness to palpation on the right side of the lumbar spine     Ambulation/Gait   Gait Comments decreased hip rotation. Decreased hip flexion on the right, lean towards the right.                    [place] Adult PT Treatment/Exercise - 10/19/16 0001      Lumbar Exercises: Stretches   Single Knee to Chest Stretch Limitations 2x30sec    Piriformis Stretch Limitations 2x30sec hold      Lumbar Exercises: Supine   AB Set Limitations with PPT x10    Clam Limitations 2x10      Lumbar Exercises: Prone   Other Prone Lumbar Exercises Prone on elbows 3 minutes; Prone press up in pain free range x10. Some relief noted.      Manual Therapy   Manual therapy comments Manual long acxis distraction, left manual trigger point release to left upper glut                PT Education - 10/19/16 1202    Education provided Yes   Education Details HEP, anatomy, symptom management    Person(s) Educated Patient   Methods Explanation;Demonstration   Comprehension Verbalized understanding;Returned demonstration;Verbal cues required;Tactile cues required;Need further instruction          PT Short Term Goals - 10/19/16 1212      PT SHORT TERM GOAL #1   Title Patient will rpeort centralized lower back pain with no pain radiating into the right lower extremity    Time 3   Period Weeks   Status New     PT SHORT TERM GOAL #2   Title Patient will increase gross left lower extremity strength to 5/5  Time 3   Period Weeks   Status New     PT SHORT TERM GOAL #3   Title Patient will demsotrate a good core contraction    Time 8   Period Weeks   Status New     PT SHORT TERM GOAL #4   Title Patient will be independnet with initial HEP    Time 8   Period Weeks   Status New           PT Long Term Goals - 10/19/16 1213      PT LONG TERM GOAL #1   Title Patient will stand for 2 hours without increased lower back pain in order to perfrom his job    Time 6   Period Weeks   Status New     PT LONG TERM GOAL #2   Title Patient will ambulate 3000' without increased pain    Time 6   Period Weeks   Status New     PT LONG TERM GOAL #3   Title Patient will be independent with HEPfpor stregthening and strengthening    Time 6   Period Weeks   Status New               Plan - 10/19/16 1203    Clinical Impression Statement Patient is a 50 year old male with radicular back pain into his right leg. He has right leg weakness. He has spasming into his right upper gluteal. He has increased radicular pain into his right leg with forward flexion. He reported releif in prone. He has a positive straight leg leg test at 45 degrees. He shows signs of a lumbar disc buldge. He would benefit from furter skilled therapy.    Rehab Potential Good   PT Frequency 2x / week   PT Duration 6 weeks   PT Treatment/Interventions ADLs/Self Care Home Management;Cryotherapy;Electrical Stimulation;Iontophoresis 8m/ml Dexamethasone;Moist Heat;Ultrasound;Gait training;Stair training;Cognitive remediation;Therapeutic exercise;Therapeutic activities;Functional mobility training;Patient/family education;Manual techniques;Dry needling;Taping;Neuromuscular re-education;Passive range of motion   PT Next Visit Plan continue to assess reaction to prone activity, add core strengthening if able, continue with manual therapy to improve hip flexion and decrease spasming in the lumbar spine. Consider ball squeeze with  hip adduction, long axis distraction of the right leg, If patient can not tolerate core stregthening with LE activity consider USE activity, review lifting technique.    Consulted and Agree with Plan of Care Patient      Patient will benefit from skilled therapeutic intervention in order to improve the following deficits and impairments:  Decreased endurance, Decreased safety awareness, Decreased strength, Decreased mobility, Pain, Decreased range of motion, Difficulty walking, Increased muscle spasms, Decreased activity tolerance, Impaired sensation, Postural dysfunction  Visit Diagnosis: Chronic right-sided low back pain with right-sided sciatica  Muscle spasm of back  Muscle weakness (generalized)   PHYSICAL THERAPY DISCHARGE SUMMARY  Visits from Start of Care:1  Current functional level related to goals / functional outcomes: Patient only came to initial eval    Remaining deficits: Unknown     Education / Equipment: unknown Plan: Patient agrees to discharge.  Patient goals were not met. Patient is being discharged due to not returning since the last visit.  ?????       Problem List There are no active problems to display for this patient.   DCarney LivingPT DPT  10/19/2016, 1:14 PM  [facility]GC[place] [address]Phone: [phone]  Fax:  [phone]  Name: Daniel Briggs MRN: [id number] Date of Birth: 09-10-1966

## 2016-10-25 ENCOUNTER — Ambulatory Visit: Payer: BLUE CROSS/BLUE SHIELD

## 2016-11-07 ENCOUNTER — Ambulatory Visit: Payer: BLUE CROSS/BLUE SHIELD | Attending: Family Medicine

## 2016-11-14 ENCOUNTER — Ambulatory Visit: Payer: BLUE CROSS/BLUE SHIELD | Admitting: Physical Therapy

## 2016-11-21 ENCOUNTER — Ambulatory Visit: Payer: BLUE CROSS/BLUE SHIELD | Admitting: Physical Therapy

## 2017-01-03 DIAGNOSIS — Z6832 Body mass index (BMI) 32.0-32.9, adult: Secondary | ICD-10-CM | POA: Diagnosis not present

## 2017-01-03 DIAGNOSIS — Z23 Encounter for immunization: Secondary | ICD-10-CM | POA: Diagnosis not present

## 2017-01-03 DIAGNOSIS — Z0000 Encounter for general adult medical examination without abnormal findings: Secondary | ICD-10-CM | POA: Diagnosis not present

## 2017-02-01 DIAGNOSIS — I10 Essential (primary) hypertension: Secondary | ICD-10-CM | POA: Diagnosis not present

## 2017-02-01 DIAGNOSIS — Z6834 Body mass index (BMI) 34.0-34.9, adult: Secondary | ICD-10-CM | POA: Diagnosis not present

## 2017-02-01 DIAGNOSIS — E119 Type 2 diabetes mellitus without complications: Secondary | ICD-10-CM | POA: Diagnosis not present

## 2017-02-01 DIAGNOSIS — Z720 Tobacco use: Secondary | ICD-10-CM | POA: Diagnosis not present

## 2017-03-07 DIAGNOSIS — Z23 Encounter for immunization: Secondary | ICD-10-CM | POA: Diagnosis not present

## 2017-05-14 DIAGNOSIS — Z720 Tobacco use: Secondary | ICD-10-CM | POA: Diagnosis not present

## 2017-05-14 DIAGNOSIS — J45909 Unspecified asthma, uncomplicated: Secondary | ICD-10-CM | POA: Diagnosis not present

## 2017-05-14 DIAGNOSIS — Z23 Encounter for immunization: Secondary | ICD-10-CM | POA: Diagnosis not present

## 2017-05-14 DIAGNOSIS — I10 Essential (primary) hypertension: Secondary | ICD-10-CM | POA: Diagnosis not present

## 2017-05-14 DIAGNOSIS — J069 Acute upper respiratory infection, unspecified: Secondary | ICD-10-CM | POA: Diagnosis not present

## 2017-06-11 DIAGNOSIS — F17210 Nicotine dependence, cigarettes, uncomplicated: Secondary | ICD-10-CM | POA: Diagnosis not present

## 2017-06-11 DIAGNOSIS — S8392XA Sprain of unspecified site of left knee, initial encounter: Secondary | ICD-10-CM | POA: Diagnosis not present

## 2017-06-11 DIAGNOSIS — R079 Chest pain, unspecified: Secondary | ICD-10-CM | POA: Diagnosis not present

## 2017-06-11 DIAGNOSIS — M542 Cervicalgia: Secondary | ICD-10-CM | POA: Diagnosis not present

## 2017-06-11 DIAGNOSIS — Z5321 Procedure and treatment not carried out due to patient leaving prior to being seen by health care provider: Secondary | ICD-10-CM | POA: Diagnosis not present

## 2017-06-11 DIAGNOSIS — M25562 Pain in left knee: Secondary | ICD-10-CM | POA: Diagnosis not present

## 2017-06-11 DIAGNOSIS — S134XXA Sprain of ligaments of cervical spine, initial encounter: Secondary | ICD-10-CM | POA: Diagnosis not present

## 2017-06-11 DIAGNOSIS — T1490XA Injury, unspecified, initial encounter: Secondary | ICD-10-CM | POA: Diagnosis not present

## 2017-06-11 DIAGNOSIS — E669 Obesity, unspecified: Secondary | ICD-10-CM | POA: Diagnosis not present

## 2017-06-11 DIAGNOSIS — R51 Headache: Secondary | ICD-10-CM | POA: Diagnosis not present

## 2017-06-12 DIAGNOSIS — F17210 Nicotine dependence, cigarettes, uncomplicated: Secondary | ICD-10-CM | POA: Diagnosis not present

## 2017-06-12 DIAGNOSIS — S8392XA Sprain of unspecified site of left knee, initial encounter: Secondary | ICD-10-CM | POA: Diagnosis not present

## 2017-06-12 DIAGNOSIS — M542 Cervicalgia: Secondary | ICD-10-CM | POA: Diagnosis not present

## 2017-06-12 DIAGNOSIS — T1490XA Injury, unspecified, initial encounter: Secondary | ICD-10-CM | POA: Diagnosis not present

## 2017-06-12 DIAGNOSIS — S134XXA Sprain of ligaments of cervical spine, initial encounter: Secondary | ICD-10-CM | POA: Diagnosis not present

## 2017-06-12 DIAGNOSIS — R079 Chest pain, unspecified: Secondary | ICD-10-CM | POA: Diagnosis not present

## 2017-06-12 DIAGNOSIS — M25562 Pain in left knee: Secondary | ICD-10-CM | POA: Diagnosis not present

## 2017-06-12 DIAGNOSIS — E669 Obesity, unspecified: Secondary | ICD-10-CM | POA: Diagnosis not present

## 2017-06-14 DIAGNOSIS — Z6833 Body mass index (BMI) 33.0-33.9, adult: Secondary | ICD-10-CM | POA: Diagnosis not present

## 2017-06-14 DIAGNOSIS — Z720 Tobacco use: Secondary | ICD-10-CM | POA: Diagnosis not present

## 2017-06-14 DIAGNOSIS — I10 Essential (primary) hypertension: Secondary | ICD-10-CM | POA: Diagnosis not present

## 2017-06-14 DIAGNOSIS — J45909 Unspecified asthma, uncomplicated: Secondary | ICD-10-CM | POA: Diagnosis not present

## 2017-07-09 DIAGNOSIS — Z23 Encounter for immunization: Secondary | ICD-10-CM | POA: Diagnosis not present

## 2017-07-31 DIAGNOSIS — I10 Essential (primary) hypertension: Secondary | ICD-10-CM | POA: Diagnosis not present

## 2017-07-31 DIAGNOSIS — F17200 Nicotine dependence, unspecified, uncomplicated: Secondary | ICD-10-CM | POA: Diagnosis not present

## 2017-07-31 DIAGNOSIS — Z6833 Body mass index (BMI) 33.0-33.9, adult: Secondary | ICD-10-CM | POA: Diagnosis not present

## 2017-07-31 DIAGNOSIS — Z23 Encounter for immunization: Secondary | ICD-10-CM | POA: Diagnosis not present

## 2017-07-31 DIAGNOSIS — E663 Overweight: Secondary | ICD-10-CM | POA: Diagnosis not present

## 2017-09-03 DIAGNOSIS — J45909 Unspecified asthma, uncomplicated: Secondary | ICD-10-CM | POA: Diagnosis not present

## 2017-09-03 DIAGNOSIS — I10 Essential (primary) hypertension: Secondary | ICD-10-CM | POA: Diagnosis not present

## 2017-09-03 DIAGNOSIS — J069 Acute upper respiratory infection, unspecified: Secondary | ICD-10-CM | POA: Diagnosis not present

## 2017-09-03 DIAGNOSIS — Z720 Tobacco use: Secondary | ICD-10-CM | POA: Diagnosis not present

## 2017-09-17 DIAGNOSIS — Z23 Encounter for immunization: Secondary | ICD-10-CM | POA: Diagnosis not present

## 2018-04-18 ENCOUNTER — Ambulatory Visit: Payer: BLUE CROSS/BLUE SHIELD | Attending: Family Medicine

## 2018-06-02 ENCOUNTER — Emergency Department (HOSPITAL_COMMUNITY): Payer: Self-pay

## 2018-06-02 ENCOUNTER — Encounter (HOSPITAL_COMMUNITY): Payer: Self-pay | Admitting: *Deleted

## 2018-06-02 ENCOUNTER — Other Ambulatory Visit: Payer: Self-pay

## 2018-06-02 ENCOUNTER — Emergency Department (HOSPITAL_COMMUNITY)
Admission: EM | Admit: 2018-06-02 | Discharge: 2018-06-02 | Disposition: A | Discharge status: Home / Self Care | Payer: Self-pay | Attending: Emergency Medicine | Admitting: Emergency Medicine

## 2018-06-02 DIAGNOSIS — R319 Hematuria, unspecified: Secondary | ICD-10-CM | POA: Insufficient documentation

## 2018-06-02 DIAGNOSIS — R05 Cough: Secondary | ICD-10-CM | POA: Insufficient documentation

## 2018-06-02 DIAGNOSIS — R42 Dizziness and giddiness: Secondary | ICD-10-CM

## 2018-06-02 DIAGNOSIS — R0789 Other chest pain: Secondary | ICD-10-CM

## 2018-06-02 DIAGNOSIS — M5431 Sciatica, right side: Secondary | ICD-10-CM

## 2018-06-02 DIAGNOSIS — N390 Urinary tract infection, site not specified: Secondary | ICD-10-CM

## 2018-06-02 DIAGNOSIS — F17200 Nicotine dependence, unspecified, uncomplicated: Secondary | ICD-10-CM | POA: Insufficient documentation

## 2018-06-02 DIAGNOSIS — Z79899 Other long term (current) drug therapy: Secondary | ICD-10-CM | POA: Insufficient documentation

## 2018-06-02 DIAGNOSIS — I10 Essential (primary) hypertension: Secondary | ICD-10-CM

## 2018-06-02 LAB — CBC
HCT: 44.4 % (ref 39.0–52.0)
Hemoglobin: 15.9 g/dL (ref 13.0–17.0)
MCH: 31.4 pg (ref 26.0–34.0)
MCHC: 35.8 g/dL (ref 30.0–36.0)
MCV: 87.6 fL (ref 78.0–100.0)
Platelets: 258 10*3/uL (ref 150–400)
RBC: 5.07 MIL/uL (ref 4.22–5.81)
RDW: 12.8 % (ref 11.5–15.5)
WBC: 6.2 10*3/uL (ref 4.0–10.5)

## 2018-06-02 LAB — I-STAT TROPONIN, ED
Troponin i, poc: 0 ng/mL (ref 0.00–0.08)
Troponin i, poc: 0 ng/mL (ref 0.00–0.08)

## 2018-06-02 LAB — BASIC METABOLIC PANEL
Anion gap: 6 (ref 5–15)
BUN: 8 mg/dL (ref 6–20)
CO2: 27 mmol/L (ref 22–32)
Calcium: 9.3 mg/dL (ref 8.9–10.3)
Chloride: 108 mmol/L (ref 98–111)
Creatinine, Ser: 1.06 mg/dL (ref 0.61–1.24)
GFR calc Af Amer: >60 mL/min (ref >60)
GFR calc non Af Amer: >60 mL/min (ref >60)
Glucose, Bld: 110 mg/dL — ABNORMAL HIGH (ref 70–99)
Potassium: 3.7 mmol/L (ref 3.5–5.1)
Sodium: 141 mmol/L (ref 135–145)

## 2018-06-02 LAB — URINALYSIS, ROUTINE W REFLEX MICROSCOPIC
Bilirubin Urine: NEGATIVE
Glucose, UA: NEGATIVE mg/dL
Ketones, ur: NEGATIVE mg/dL
Nitrite: NEGATIVE
Protein, ur: NEGATIVE mg/dL
Specific Gravity, Urine: 1.005 (ref 1.005–1.030)
pH: 5 (ref 5.0–8.0)

## 2018-06-02 MED ORDER — DOXYCYCLINE HYCLATE 100 MG PO CAPS: 100.0000 mg | ORAL_CAPSULE | Freq: Two times a day (BID) | ORAL | 0 refills | Status: DC

## 2018-06-02 NOTE — ED Notes (Signed)
Patient transported to CT 

## 2018-06-02 NOTE — ED Provider Notes (Signed)
[facility] EMERGENCY DEPARTMENT Provider Note   CSN: [id number] Arrival date & time: 06/02/18  1112     History   Chief Complaint Chief Complaint  Patient presents with  . Dizziness  . Chest Pain    HPI Daniel Briggs is a 52 y.o. male.  52yo male presents with multiple complaints. Patient states he is here today feeling lightheaded, onset yesterday morning, worse with changes in position, states he feels like the room is spinning if he tries to stand up. Denies recent coughs or colds, states is a chronic cough due to smoking.  Also here for right lower back pain, previously diagnosed with sciatica, has completed physical therapy without any relief in his pain.  Also here for left-sided chest pain onset yesterday morning, constant, worse at times, described as sharp in nature.  Nothing makes his chest pain better or worse, pain does not radiate, denies associated nausea, vomiting, sweats, shortness of breath, denies cocaine/drug use.  States he had a similar episode one year ago, brought to this emergency room by EMS at that time.  Patient is a daily smoker, history of high blood pressure, intermittently compliant with blood pressure medication, states he has been on several medications over the past few years without control of his blood pressure.  Denies history of high cholesterol or diabetes.  Reports family history significant for his mother dying from a heart attack at 75 and his father deceased from MI at 8.     Past Medical History:  Diagnosis Date  . Hypertension     There are no active problems to display for this patient.   History reviewed. No pertinent surgical history.      Home Medications    Prior to Admission medications   Medication Sig Start Date End Date Taking? Authorizing Provider  lisinopril-hydrochlorothiazide (PRINZIDE,ZESTORETIC) 20-25 MG tablet Take 1 tablet by mouth daily.   Yes [provider]  doxycycline  (VIBRAMYCIN) 100 MG capsule Take 1 capsule (100 mg total) by mouth 2 (two) times daily. 06/02/18   Jeannie Fend, PA-C  hydrochlorothiazide (HYDRODIURIL) 12.5 MG tablet Take 1 tablet (12.5 mg total) by mouth daily. Patient not taking: Reported on 06/02/2018 10/20/15   Mabe, Latanya Maudlin, MD    Family History History reviewed. No pertinent family history.  Social History Social History   Tobacco Use  . Smoking status: Current Every Day Smoker  . Smokeless tobacco: Never Used  Substance Use Topics  . Alcohol use: Yes  . Drug use: Yes    Types: Marijuana     Allergies   Penicillins   Review of Systems Review of Systems  Constitutional: Negative for chills and fever.  HENT: Negative for congestion, sinus pressure and sinus pain.   Eyes: Negative for pain and visual disturbance.  Respiratory: Positive for cough. Negative for chest tightness, shortness of breath and wheezing.   Cardiovascular: Positive for chest pain. Negative for palpitations and leg swelling.  Gastrointestinal: Negative for abdominal pain, constipation, diarrhea, nausea and vomiting.  Genitourinary: Negative for difficulty urinating.  Musculoskeletal: Positive for back pain. Negative for gait problem.  Skin: Negative for rash and wound.  Allergic/Immunologic: Negative for immunocompromised state.  Neurological: Positive for dizziness and light-headedness. Negative for syncope, speech difficulty, weakness, numbness and headaches.  Psychiatric/Behavioral: Negative for confusion.  All other systems reviewed and are negative.    Physical Exam Updated Vital Signs BP (!) 164/96   Pulse (!) 59   Temp 98 F (36.7 C) (  Oral)   Resp 17   Ht 5\' 8"  (1.727 m)   Wt 98.9 kg (218 lb)   SpO2 97%   BMI 33.15 kg/m   Physical Exam  Constitutional: He is oriented to person, place, and time. He appears well-developed and well-nourished.  Non-toxic appearance. He does not appear ill. No distress.  HENT:  Head:  Normocephalic and atraumatic.  Eyes: Pupils are equal, round, and reactive to light. EOM are normal.  Neck: Normal range of motion. Neck supple.  Cardiovascular: Normal rate, regular rhythm, intact distal pulses and normal pulses.  Pulmonary/Chest: Effort normal and breath sounds normal. No respiratory distress.  Abdominal: Soft. He exhibits no distension. There is no tenderness.  Musculoskeletal:       Back:       Right lower leg: He exhibits no tenderness and no edema.       Left lower leg: He exhibits no tenderness and no edema.  Neurological: He is alert and oriented to person, place, and time. He has normal strength. He is not disoriented. No cranial nerve deficit or sensory deficit. He displays a negative Romberg sign. He displays no Babinski's sign on the right side. He displays no Babinski's sign on the left side.  Reflex Scores:      Patellar reflexes are 2+ on the right side and 2+ on the left side. Skin: Skin is warm and dry. Capillary refill takes less than 2 seconds. No rash noted.  Psychiatric: He has a normal mood and affect. His behavior is normal. His mood appears not anxious. He is not agitated.  Nursing note and vitals reviewed.    ED Treatments / Results  Labs (all labs ordered are listed, but only abnormal results are displayed) Labs Reviewed  BASIC METABOLIC PANEL - Abnormal; Notable for the following components:      Result Value   Glucose, Bld 110 (*)    All other components within normal limits  URINALYSIS, ROUTINE W REFLEX MICROSCOPIC - Abnormal; Notable for the following components:   Color, Urine STRAW (*)    Hgb urine dipstick SMALL (*)    Leukocytes, UA MODERATE (*)    Bacteria, UA RARE (*)    All other components within normal limits  URINE CULTURE  CBC  I-STAT TROPONIN, ED  I-STAT TROPONIN, ED  GC/CHLAMYDIA PROBE AMP ([place]) NOT AT [facility]    EKG EKG Interpretation  Date/Time:  Monday June 02 2018 11:21:18 EDT Ventricular Rate:  68 PR  Interval:  138 QRS Duration: 84 QT Interval:  384 QTC Calculation: 408 R Axis:   43 Text Interpretation:  Normal sinus rhythm with sinus arrhythmia Normal ECG Confirmed by Kennis CarinaBero, Michael [phone]([id number]) on 06/02/2018 2:38:53 PM   Radiology Dg Chest 2 View  Result Date: 06/02/2018 CLINICAL DATA:  Patient states he has felt some dizziness and light headed since yesterday. Center chest pains. Denies sob. Dry cough. States he was not exerting himself when symptoms onset. 35 year smoker. Hx of asthma, htn takes medication. EXAM: CHEST - 2 VIEW COMPARISON:  10/20/2015 FINDINGS: The heart size and mediastinal contours are within normal limits. Both lungs are clear. The visualized skeletal structures are unremarkable. IMPRESSION: No active cardiopulmonary disease. Electronically Signed   By: Norva PavlovElizabeth  Brown M.D.   On: 06/02/2018 11:44   Ct Head Wo Contrast  Result Date: 06/02/2018 CLINICAL DATA:  Dizziness since waking up yesterday, frontal headache like a sinus infection, episodic peripheral vertigo, history hypertension, smoking EXAM: CT HEAD WITHOUT CONTRAST CT CERVICAL  SPINE WITHOUT CONTRAST TECHNIQUE: Multidetector CT imaging of the head and cervical spine was performed following the standard protocol without intravenous contrast. Multiplanar CT image reconstructions of the cervical spine were also generated. COMPARISON:  None FINDINGS: CT HEAD FINDINGS Brain: Normal ventricular morphology. No midline shift or mass effect. Two tiny foci of high attenuation basal ganglia, without surrounding edema, likely developing calcification based on location. Beam hardening artifacts at brainstem. Otherwise normal appearance of brain parenchyma. No intracranial hemorrhage, mass lesion, or evidence of acute infarction. No extra-axial fluid collections. Vascular: No hyperdense vessels Skull: Intact Sinuses/Orbits: Clear Other: N/A CT CERVICAL SPINE FINDINGS Alignment: Normal Skull base and vertebrae: Osseous mineralization  normal. Visualized skull base intact. Vertebral body heights maintained without fracture or bone destruction. Scattered facet degenerative changes. Disc space narrowing with endplate spur formation at C5-C6. Soft tissues and spinal canal: Prevertebral soft tissues normal thickness. Scattered normal sized thoracic nodes. Tiny nonspecific RIGHT thyroid nodules. Disc levels:  No significant abnormalities Upper chest: Lung apices clear. Other: N/A IMPRESSION: No acute intracranial abnormalities. 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 No acute cervical spine abnormalities. Electronically Signed   By: Ulyses Southward M.D.   On: 06/02/2018 15:08   Ct Cervical Spine Wo Contrast  Result Date: 06/02/2018 CLINICAL DATA:  Dizziness since waking up yesterday, frontal headache like a sinus infection, episodic peripheral vertigo, history hypertension, smoking EXAM: CT HEAD WITHOUT CONTRAST CT CERVICAL SPINE WITHOUT CONTRAST TECHNIQUE: Multidetector CT imaging of the head and cervical spine was performed following the standard protocol without intravenous contrast. Multiplanar CT image reconstructions of the cervical spine were also generated. COMPARISON:  None FINDINGS: CT HEAD FINDINGS Brain: Normal ventricular morphology. No midline shift or mass effect. Two tiny foci of high attenuation basal ganglia, without surrounding edema, likely developing calcification based on location. Beam hardening artifacts at brainstem. Otherwise normal appearance of brain parenchyma. No intracranial hemorrhage, mass lesion, or evidence of acute infarction. No extra-axial fluid collections. Vascular: No hyperdense vessels Skull: Intact Sinuses/Orbits: Clear Other: N/A CT CERVICAL SPINE FINDINGS Alignment: Normal Skull base and vertebrae: Osseous mineralization normal. Visualized skull base intact. Vertebral body heights maintained without fracture or bone destruction. Scattered facet degenerative changes. Disc space  narrowing with endplate spur formation at C5-C6. Soft tissues and spinal canal: Prevertebral soft tissues normal thickness. Scattered normal sized thoracic nodes. Tiny nonspecific RIGHT thyroid nodules. Disc levels:  No significant abnormalities Upper chest: Lung apices clear. Other: N/A IMPRESSION: No acute intracranial abnormalities. 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 No acute cervical spine abnormalities. Electronically Signed   By: Ulyses Southward M.D.   On: 06/02/2018 15:08    Procedures Procedures (including critical care time)  Medications Ordered in ED Medications - No data to display   Initial Impression / Assessment and Plan / ED Course  I have reviewed the triage vital signs and the nursing notes.  Pertinent labs & imaging results that were available during my care of the patient were reviewed by me and considered in my medical decision making (see chart for details).  Clinical Course as of Jun 02 1549  Mon Jun 02, 2018  1247 Heart score 3, low risk   [LM]  7422 52 year old male presents with complaint of dizziness with changes in position onset yesterday, no history of injuries.  Also reports ongoing right sciatica and upon further questioning states he has had some chest pain.  EKG is unremarkable, troponins negative x2   [LM]    Clinical  Course User Index [LM] Jeannie Fend, PA-C    Final Clinical Impressions(s) / ED Diagnoses   Final diagnoses:  Dizziness  Atypical chest pain  Sciatica of right side  Urinary tract infection with hematuria, site unspecified  Hypertension, unspecified type    ED Discharge Orders        Ordered    doxycycline (VIBRAMYCIN) 100 MG capsule  2 times daily     06/02/18 1545       Jeannie Fend, PA-C 06/02/18 1550    Sabas Sous, MD 06/02/18 2358

## 2018-06-02 NOTE — Discharge Instructions (Signed)
Follow-up with your primary care provider this week.  Take antibiotics as prescribed for urinary tract infection, call the hospital in 2 days for your test results.  Take your blood pressure medication as prescribed, monitor your blood pressure and take readings to your primary care follow-up.  Return to the emergency room for any worsening or concerning symptoms.

## 2018-06-02 NOTE — ED Notes (Signed)
Pt stable, ambulatory, states understanding of discharge instructions 

## 2018-06-02 NOTE — ED Notes (Signed)
Pt back from CT

## 2018-06-02 NOTE — ED Triage Notes (Addendum)
Pt in c/o dizziness since he woke up yesterday, also frontal headache "like a sinus infection", denies weakness or focal deficits, no distress noted. Pt also reports chest pain that also started yesterday, pain radiates into his back

## 2018-06-03 LAB — URINE CULTURE: Culture: NO GROWTH

## 2018-07-20 ENCOUNTER — Other Ambulatory Visit: Payer: Self-pay

## 2018-07-20 ENCOUNTER — Encounter (HOSPITAL_COMMUNITY): Payer: Self-pay

## 2018-07-20 ENCOUNTER — Emergency Department (HOSPITAL_COMMUNITY): Payer: Self-pay

## 2018-07-20 ENCOUNTER — Emergency Department (HOSPITAL_COMMUNITY)
Admission: EM | Admit: 2018-07-20 | Discharge: 2018-07-20 | Disposition: A | Discharge status: Home / Self Care | Payer: Self-pay | Attending: Emergency Medicine | Admitting: Emergency Medicine

## 2018-07-20 DIAGNOSIS — N3000 Acute cystitis without hematuria: Secondary | ICD-10-CM | POA: Insufficient documentation

## 2018-07-20 DIAGNOSIS — F17210 Nicotine dependence, cigarettes, uncomplicated: Secondary | ICD-10-CM | POA: Insufficient documentation

## 2018-07-20 DIAGNOSIS — I10 Essential (primary) hypertension: Secondary | ICD-10-CM | POA: Insufficient documentation

## 2018-07-20 DIAGNOSIS — R52 Pain, unspecified: Secondary | ICD-10-CM

## 2018-07-20 DIAGNOSIS — N453 Epididymo-orchitis: Secondary | ICD-10-CM | POA: Insufficient documentation

## 2018-07-20 DIAGNOSIS — Z79899 Other long term (current) drug therapy: Secondary | ICD-10-CM | POA: Insufficient documentation

## 2018-07-20 LAB — URINALYSIS, ROUTINE W REFLEX MICROSCOPIC
Bilirubin Urine: NEGATIVE
Glucose, UA: NEGATIVE mg/dL
Ketones, ur: NEGATIVE mg/dL
Nitrite: NEGATIVE
Protein, ur: NEGATIVE mg/dL
Specific Gravity, Urine: 1.018 (ref 1.005–1.030)
WBC, UA: >50 WBC/hpf — ABNORMAL HIGH (ref 0–5)
pH: 5 (ref 5.0–8.0)

## 2018-07-20 LAB — RAPID HIV SCREEN (HIV 1/2 AB+AG)
HIV 1/2 Antibodies: NONREACTIVE
HIV-1 P24 Antigen - HIV24: NONREACTIVE

## 2018-07-20 MED ORDER — CIPROFLOXACIN HCL 500 MG PO TABS: 500.0000 mg | ORAL_TABLET | Freq: Two times a day (BID) | ORAL | 0 refills | Status: DC

## 2018-07-20 NOTE — ED Triage Notes (Signed)
Pt presents for evaluation of testicular swelling with pain to L side. States he has not had any injury to area. States started as tingling sensation yesterday morning.

## 2018-07-20 NOTE — ED Notes (Signed)
Patient able to ambulate independently  

## 2018-07-20 NOTE — Discharge Instructions (Addendum)
Use Tylenol Motrin for pain.  Wear comfortable underwear.

## 2018-07-20 NOTE — ED Notes (Signed)
Pt transported to US

## 2018-07-20 NOTE — ED Provider Notes (Signed)
[facility] EMERGENCY DEPARTMENT Provider Note   CSN: [id number] Arrival date & time: 07/20/18  1037     History   Chief Complaint Chief Complaint  Patient presents with  . Groin Swelling    HPI Daniel Briggs is a 52 y.o. male.  The history is provided by the patient.  Male GU Problem  Primary symptoms include scrotal pain (swelling of left testicle).  Primary symptoms include no dysuria, no penile pain. This is a new problem. The current episode started 2 days ago. The problem occurs constantly. The problem has not changed since onset.The symptoms occur at rest. Pertinent negatives include no anorexia, no nausea, no vomiting, no abdominal pain, no frequency, no constipation and no diarrhea. There has been no fever. The fever has been present for less than 1 day. He has tried nothing for the symptoms. The treatment provided no relief. Sexual activity: sexually active. He never uses condoms. Partner displays symptoms of an STD: no.    Past Medical History:  Diagnosis Date  . Hypertension     There are no active problems to display for this patient.   History reviewed. No pertinent surgical history.      Home Medications    Prior to Admission medications   Medication Sig Start Date End Date Taking? Authorizing Provider  lisinopril-hydrochlorothiazide (PRINZIDE,ZESTORETIC) 20-25 MG tablet Take 1 tablet by mouth daily.   Yes [provider]  ciprofloxacin (CIPRO) 500 MG tablet Take 1 tablet (500 mg total) by mouth every 12 (twelve) hours for 14 days. 07/20/18 08/03/18  Maxfield Gildersleeve, DO  hydrochlorothiazide (HYDRODIURIL) 12.5 MG tablet Take 1 tablet (12.5 mg total) by mouth daily. Patient not taking: Reported on 06/02/2018 10/20/15   Phillis Haggis, MD    Family History No family history on file.  Social History Social History   Tobacco Use  . Smoking status: Current Every Day Smoker  . Smokeless tobacco: Never Used  Substance Use Topics    . Alcohol use: Yes  . Drug use: Yes    Types: Marijuana     Allergies   Penicillins   Review of Systems Review of Systems  Constitutional: Negative for chills and fever.  HENT: Negative for ear pain and sore throat.   Eyes: Negative for pain and visual disturbance.  Respiratory: Negative for cough and shortness of breath.   Cardiovascular: Negative for chest pain and palpitations.  Gastrointestinal: Negative for abdominal pain, anorexia, constipation, diarrhea, nausea and vomiting.  Genitourinary: Positive for scrotal swelling and testicular pain. Negative for decreased urine volume, discharge, dysuria, frequency, hematuria, penile pain, penile swelling and urgency.  Musculoskeletal: Negative for arthralgias and back pain.  Skin: Negative for color change and rash.  Neurological: Negative for seizures and syncope.  All other systems reviewed and are negative.    Physical Exam Updated Vital Signs  ED Triage Vitals  Enc Vitals Group     BP 07/20/18 1046 (!) 158/115     Pulse Rate 07/20/18 1046 88     Resp 07/20/18 1046 (!) 22     Temp 07/20/18 1046 99.3 F (37.4 C)     Temp Source 07/20/18 1046 Oral     SpO2 07/20/18 1046 98 %     Weight --      Height --      Head Circumference --      Peak Flow --      Pain Score 07/20/18 1044 9     Pain Loc --  Pain Edu? --      Excl. in GC? --     Physical Exam  Constitutional: He is oriented to person, place, and time. He appears well-developed and well-nourished.  HENT:  Head: Normocephalic and atraumatic.  Eyes: Pupils are equal, round, and reactive to light. Conjunctivae and EOM are normal.  Neck: Normal range of motion. Neck supple.  Cardiovascular: Normal rate, regular rhythm, normal heart sounds and intact distal pulses.  No murmur heard. Pulmonary/Chest: Effort normal and breath sounds normal. No respiratory distress. He has no wheezes.  Abdominal: Soft. Bowel sounds are normal. He exhibits no distension. There  is no tenderness. A hernia is present. Hernia confirmed negative in the right inguinal area and confirmed negative in the left inguinal area.  Genitourinary: Penis normal. Right testis shows no mass, no swelling and no tenderness. Cremasteric reflex is not absent on the right side. Left testis shows swelling and tenderness. Left testis shows no mass. Cremasteric reflex is not absent on the left side. No penile tenderness.  Musculoskeletal: Normal range of motion. He exhibits no edema.  Neurological: He is alert and oriented to person, place, and time.  Skin: Skin is warm and dry. Capillary refill takes less than 2 seconds.  Psychiatric: He has a normal mood and affect.  Nursing note and vitals reviewed.    ED Treatments / Results  Labs (all labs ordered are listed, but only abnormal results are displayed) Labs Reviewed  URINALYSIS, ROUTINE W REFLEX MICROSCOPIC - Abnormal; Notable for the following components:      Result Value   APPearance HAZY (*)    Hgb urine dipstick MODERATE (*)    Leukocytes, UA LARGE (*)    WBC, UA >50 (*)    Bacteria, UA RARE (*)    All other components within normal limits  URINE CULTURE  RAPID HIV SCREEN (HIV 1/2 AB+AG)  HIV ANTIBODY (ROUTINE TESTING W REFLEX)  GC/CHLAMYDIA PROBE AMP ([place]) NOT AT [facility]    EKG None  Radiology US Scrotum W/doppler  Result Date: 07/20/2018 CLINICAL DATA:  LEFT testicular pain with swelling since yesterday. EXAM: SCROTAL ULTRASOUND DOPPLER ULTRASOUND OF THE TESTICLES TECHNIQUE: Complete ultrasound examination of the testicles, epididymis, and other scrotal structures was performed. Color and spectral Doppler ultrasound were also utilized to evaluate blood flow to the testicles. COMPARISON:  None. FINDINGS: Right testicle Measurements: 4.3 x 2.5 x 3.3 cm. No mass or microlithiasis visualized. Left testicle Measurements: 3.8 x 2.6 x 2.9 cm. No mass or microlithiasis visualized. Increased vascularity indicating orchitis.  Right epididymis:  Normal in size and appearance. Left epididymis: Enlarged and highly vascular indicating epididymitis. Hydrocele:  Small LEFT-sided hydrocele. Varicocele:  None visualized. Pulsed Doppler interrogation of both testes demonstrates normal low resistance arterial and venous waveforms bilaterally. Scrotal skin thickening. IMPRESSION: 1. Findings are consistent with LEFT-sided epididymitis and orchitis. Associated scrotal wall edema/skin thickening. 2. Small LEFT-sided hydrocele. 3. RIGHT testicle appears normal. Electronically Signed   By: Bary Richard M.D.   On: 07/20/2018 13:49    Procedures Procedures (including critical care time)  Medications Ordered in ED Medications - No data to display   Initial Impression / Assessment and Plan / ED Course  I have reviewed the triage vital signs and the nursing notes.  Pertinent labs & imaging results that were available during my care of the patient were reviewed by me and considered in my medical decision making (see chart for details).     Daniel Briggs is a 52 year old  male with no significant medical history who presents to the ED with left testicular swelling and pain.  Patient with normal vitals.  No fever.  Patient had some dysuria several days ago but nothing currently.  No concern for STD.  Patient with left testicular swelling, tender to the touch.  Ultrasound was performed that showed epididymitis/orchitis.  No signs of torsion.  Urinalysis shows signs of possible infection.  Gonorrhea and Chlamydia testing sent for.  Patient hemodynamically stable.  Given prescription for ciprofloxacin.  Recommend Motrin and Tylenol for pain.  Told to wear more comfortable underwear.  Recommend close follow-up with primary care doctor and told to return to the ED symptoms worsen.  Final Clinical Impressions(s) / ED Diagnoses   Final diagnoses:  Pain  Epididymo-orchitis, acute  Acute cystitis without hematuria    ED Discharge Orders          Ordered    ciprofloxacin (CIPRO) 500 MG tablet  Every 12 hours     07/20/18 1439           Virgina NorfolkCuratolo, Hari Casaus, DO 07/20/18 1449

## 2018-07-21 LAB — HIV ANTIBODY (ROUTINE TESTING W REFLEX): HIV Screen 4th Generation wRfx: NONREACTIVE

## 2018-07-21 LAB — GC/CHLAMYDIA PROBE AMP (~~LOC~~) NOT AT ARMC
Chlamydia: NEGATIVE
Neisseria Gonorrhea: NEGATIVE

## 2018-07-22 LAB — URINE CULTURE: Culture: >=100000 — AB

## 2018-07-23 ENCOUNTER — Other Ambulatory Visit: Payer: Self-pay

## 2018-07-23 ENCOUNTER — Emergency Department (HOSPITAL_COMMUNITY)
Admission: EM | Admit: 2018-07-23 | Discharge: 2018-07-23 | Disposition: A | Discharge status: Home / Self Care | Payer: Self-pay | Attending: Emergency Medicine | Admitting: Emergency Medicine

## 2018-07-23 ENCOUNTER — Telehealth: Payer: Self-pay | Admitting: *Deleted

## 2018-07-23 DIAGNOSIS — N453 Epididymo-orchitis: Secondary | ICD-10-CM | POA: Insufficient documentation

## 2018-07-23 DIAGNOSIS — Z79899 Other long term (current) drug therapy: Secondary | ICD-10-CM | POA: Insufficient documentation

## 2018-07-23 DIAGNOSIS — I10 Essential (primary) hypertension: Secondary | ICD-10-CM | POA: Insufficient documentation

## 2018-07-23 DIAGNOSIS — F17210 Nicotine dependence, cigarettes, uncomplicated: Secondary | ICD-10-CM | POA: Insufficient documentation

## 2018-07-23 LAB — CBC WITH DIFFERENTIAL/PLATELET
Basophils Absolute: 0 10*3/uL (ref 0.0–0.1)
Basophils Relative: 0 %
Eosinophils Absolute: 0 10*3/uL (ref 0.0–0.7)
Eosinophils Relative: 0 %
HCT: 41.8 % (ref 39.0–52.0)
Hemoglobin: 15.5 g/dL (ref 13.0–17.0)
Lymphocytes Relative: 5 %
Lymphs Abs: 1.4 10*3/uL (ref 0.7–4.0)
MCH: 32.1 pg (ref 26.0–34.0)
MCHC: 37.1 g/dL — ABNORMAL HIGH (ref 30.0–36.0)
MCV: 86.5 fL (ref 78.0–100.0)
Monocytes Absolute: 1.6 10*3/uL — ABNORMAL HIGH (ref 0.1–1.0)
Monocytes Relative: 6 %
Neutro Abs: 24.4 10*3/uL — ABNORMAL HIGH (ref 1.7–7.7)
Neutrophils Relative %: 89 %
Platelets: 319 10*3/uL (ref 150–400)
RBC: 4.83 MIL/uL (ref 4.22–5.81)
RDW: 13.4 % (ref 11.5–15.5)
WBC: 27.4 10*3/uL — ABNORMAL HIGH (ref 4.0–10.5)

## 2018-07-23 LAB — BASIC METABOLIC PANEL
Anion gap: 13 (ref 5–15)
BUN: 14 mg/dL (ref 6–20)
CO2: 28 mmol/L (ref 22–32)
Calcium: 9.4 mg/dL (ref 8.9–10.3)
Chloride: 100 mmol/L (ref 98–111)
Creatinine, Ser: 1.14 mg/dL (ref 0.61–1.24)
GFR calc Af Amer: >60 mL/min (ref >60)
GFR calc non Af Amer: >60 mL/min (ref >60)
Glucose, Bld: 130 mg/dL — ABNORMAL HIGH (ref 70–99)
Potassium: 3.6 mmol/L (ref 3.5–5.1)
Sodium: 141 mmol/L (ref 135–145)

## 2018-07-23 LAB — URINALYSIS, ROUTINE W REFLEX MICROSCOPIC
Bacteria, UA: NONE SEEN
Bilirubin Urine: NEGATIVE
Glucose, UA: NEGATIVE mg/dL
Ketones, ur: 5 mg/dL — AB
Nitrite: NEGATIVE
Protein, ur: 100 mg/dL — AB
Specific Gravity, Urine: 1.019 (ref 1.005–1.030)
WBC, UA: >50 WBC/hpf — ABNORMAL HIGH (ref 0–5)
pH: 5 (ref 5.0–8.0)

## 2018-07-23 MED ORDER — KETOROLAC TROMETHAMINE 30 MG/ML IJ SOLN
30.0000 mg | Freq: Once | INTRAMUSCULAR | Status: AC
  Administered 2018-07-23: 30 mg via INTRAVENOUS
  Filled 2018-07-23: qty 1

## 2018-07-23 MED ORDER — SODIUM CHLORIDE 0.9 % IV SOLN
1.0000 g | Freq: Once | INTRAVENOUS | Status: AC
  Administered 2018-07-23: 1 g via INTRAVENOUS
  Filled 2018-07-23: qty 10

## 2018-07-23 MED ORDER — SODIUM CHLORIDE 0.9 % IV BOLUS
1000.0000 mL | Freq: Once | INTRAVENOUS | Status: AC
  Administered 2018-07-23: 1000 mL via INTRAVENOUS

## 2018-07-23 MED ORDER — CEPHALEXIN 250 MG PO CAPS: 250.0000 mg | ORAL_CAPSULE | Freq: Four times a day (QID) | ORAL | 0 refills | Status: DC

## 2018-07-23 NOTE — Progress Notes (Signed)
ED Antimicrobial Stewardship Positive Culture Follow Up   Daniel Briggs is an 52 y.o. male who [facility] with a chief complaint of  Chief Complaint  Patient presents with  . Groin Swelling    Recent Results (from the past 720 hour(s))  Urine culture     Status: Abnormal   Collection Time: 07/20/18  1:25 PM  Result Value Ref Range Status   Specimen Description URINE, CLEAN CATCH  Final   Special Requests   Final    NONE Performed at [facility], [address] [place], [place] [address]    Culture >=100,000 COLONIES/mL ESCHERICHIA COLI (A)  Final   Report Status 07/22/2018 FINAL  Final   Organism ID, Bacteria ESCHERICHIA COLI (A)  Final      Susceptibility   Escherichia coli - MIC*    AMPICILLIN >=32 RESISTANT Resistant     CEFAZOLIN <=4 SENSITIVE Sensitive     CEFTRIAXONE <=1 SENSITIVE Sensitive     CIPROFLOXACIN >=4 RESISTANT Resistant     GENTAMICIN <=1 SENSITIVE Sensitive     IMIPENEM <=0.25 SENSITIVE Sensitive     NITROFURANTOIN <=16 SENSITIVE Sensitive     TRIMETH/SULFA >=320 RESISTANT Resistant     AMPICILLIN/SULBACTAM 16 INTERMEDIATE Intermediate     PIP/TAZO <=4 SENSITIVE Sensitive     Extended ESBL NEGATIVE Sensitive     * >=100,000 COLONIES/mL ESCHERICHIA COLI    [x]  Treated with ciprofloxacin, organism resistant to prescribed antimicrobial Pharmacist called patient at home and he said that he followed up with his PCP yesterday. Pharmacist contacted PCP's office and faxed them the results of the culture and sensitivities. PCP's office (Dr. Thereasa ParkinElizabeth Dempsey) will have further follow-up with the patient.  ED Provider: Sharen Hecklaudia Gibbons, PA   Tera Materatherine A Fatih Stalvey 07/23/2018, 10:09 AM Clinical Pharmacist Monday - Friday phone -  [phone] Saturday - Sunday phone - [phone]

## 2018-07-23 NOTE — Discharge Instructions (Addendum)
You were evaluated in the emergency department for continued left testicle pain and swelling.  The antibiotics that you are placed on during your last visit were not treating your infection as the bacteria was resistant to it.  We gave you an IV dose of antibiotics and are placing you on a new oral antibiotic to take.  Please stop the ciprofloxacin and take the new antibiotic.  We are also giving you the number for a urologist to follow-up with.  If you have any worsening symptoms please return to the emergency department.

## 2018-07-23 NOTE — Telephone Encounter (Signed)
Post ED Visit - Positive Culture Follow-up  Culture report reviewed by antimicrobial stewardship pharmacist:  []  Daniel Briggs, Pharm.D. []  Daniel Briggs, Pharm.D., BCPS AQ-ID []  Daniel Briggs, Pharm.D., BCPS []  Daniel Briggs, [address] BCPS []  Daniel Briggs, [address] BCPS, AAHIVP []  Daniel Briggs, Pharm.D., BCPS, AAHIVP []  Daniel Briggs, PharmD, BCPS []  Daniel Briggs, PharmD, BCPS []  Daniel Briggs, PharmD, BCPS []  Daniel Briggs, PharmD [place], PharmD  Positive urine culture Culture results faxed to PCP for follow-up  Daniel Briggs, Daniel Briggs 07/23/2018, 12:15 PM

## 2018-07-23 NOTE — ED Triage Notes (Signed)
Patient arrives with c/o testicle swelling. Patient was seen at [facility] 07/20/18. Patient was dx with ecoli, started on cipro, medication causing nausea/vomiting. Patient changed antibiotics today. Patient was seen at doctors office today and told to come to ED for further evaluation. +abdominal pain, +scrotal swelling, +N.V

## 2018-07-23 NOTE — ED Provider Notes (Signed)
[facility]-EMERGENCY DEPT Provider Note   CSN: [id number] Arrival date & time: 07/23/18  1501     History   Chief Complaint Chief Complaint  Patient presents with  . Testicle Pain  . Abdominal Pain    HPI Daniel Briggs is a 52 y.o. male.  He was referred here by his primary care doctor for further evaluation of testicular pain.  Says everything started on Saturday with acute left testicle pain.  He was seen at [facility] and had an ultrasound that showed an orchitis and signs of UTI and was put on Cipro.  He got a call yesterday that he needed to switch antibiotics and also saw his primary care doctor yesterday.  They recommended he come here for further evaluation as his symptoms were not improving.  He continues with severe left testicle pain along with pain in his lower abdomen and frequent urination.  He has had some subjective fevers and also had 2 episodes of vomiting with associated nausea.  No chest pain no shortness of breath no numbness no weakness no headache.  No sores on the penis and is noticed no discharge. The history is provided by the patient.  Testicle Pain  This is a new problem. Episode onset: 5 days. The problem occurs constantly. The problem has not changed since onset.Associated symptoms include abdominal pain. Pertinent negatives include no chest pain, no headaches and no shortness of breath. Nothing aggravates the symptoms. Nothing relieves the symptoms. The treatment provided no relief.  Abdominal Pain   This is a new problem. Episode onset: 5 days. The problem occurs constantly. The problem has not changed since onset.The pain is located in the suprapubic region. The pain is moderate. Associated symptoms include fever, nausea, vomiting, dysuria and frequency. Pertinent negatives include diarrhea, hematuria, headaches and arthralgias.    Past Medical History:  Diagnosis Date  . Hypertension     There are no active problems to display  for this patient.   No past surgical history on file.      Home Medications    Prior to Admission medications   Medication Sig Start Date End Date Taking? Authorizing Provider  ciprofloxacin (CIPRO) 500 MG tablet Take 1 tablet (500 mg total) by mouth every 12 (twelve) hours for 14 days. 07/20/18 08/03/18  Curatolo, Adam, DO  hydrochlorothiazide (HYDRODIURIL) 12.5 MG tablet Take 1 tablet (12.5 mg total) by mouth daily. Patient not taking: Reported on 06/02/2018 10/20/15   Phillis Haggis, MD  lisinopril-hydrochlorothiazide (PRINZIDE,ZESTORETIC) 20-25 MG tablet Take 1 tablet by mouth daily.    [provider]    Family History No family history on file.  Social History Social History   Tobacco Use  . Smoking status: Current Every Day Smoker  . Smokeless tobacco: Never Used  Substance Use Topics  . Alcohol use: Yes  . Drug use: Yes    Types: Marijuana     Allergies   Penicillins   Review of Systems Review of Systems  Constitutional: Positive for fever. Negative for chills.  HENT: Negative for ear pain and sore throat.   Eyes: Negative for pain and visual disturbance.  Respiratory: Negative for cough and shortness of breath.   Cardiovascular: Negative for chest pain and palpitations.  Gastrointestinal: Positive for abdominal pain, nausea and vomiting. Negative for diarrhea.  Genitourinary: Positive for dysuria, frequency and testicular pain. Negative for hematuria.  Musculoskeletal: Negative for arthralgias and back pain.  Skin: Negative for color change and rash.  Neurological:  Negative for seizures, syncope and headaches.  All other systems reviewed and are negative.    Physical Exam Updated Vital Signs BP (!) 175/112 (BP Location: Left Arm)   Pulse (!) 113   Temp 98.4 F (36.9 C) (Oral)   Resp 15   Ht 5\' 8"  (1.727 m)   Wt 98.4 kg   SpO2 96%   BMI 32.99 kg/m   Physical Exam  Constitutional: He appears well-developed and well-nourished.  HENT:    Head: Normocephalic and atraumatic.  Eyes: Conjunctivae are normal.  Neck: Neck supple.  Cardiovascular: Regular rhythm. Tachycardia present.  No murmur heard. Pulmonary/Chest: Effort normal and breath sounds normal. No respiratory distress.  Abdominal: Soft. There is no tenderness. There is no rigidity and no guarding.  Genitourinary: Penis normal. Right testis shows no mass, no swelling and no tenderness. Left testis shows swelling and tenderness. Left testis shows no mass.  Musculoskeletal: He exhibits no edema.  Neurological: He is alert.  Skin: Skin is warm and dry. Capillary refill takes less than 2 seconds.  Psychiatric: He has a normal mood and affect.  Nursing note and vitals reviewed.    ED Treatments / Results  Labs (all labs ordered are listed, but only abnormal results are displayed) Labs Reviewed  BASIC METABOLIC PANEL - Abnormal; Notable for the following components:      Result Value   Glucose, Bld 130 (*)    All other components within normal limits  CBC WITH DIFFERENTIAL/PLATELET - Abnormal; Notable for the following components:   WBC 27.4 (*)    MCHC 37.1 (*)    Neutro Abs 24.4 (*)    Monocytes Absolute 1.6 (*)    All other components within normal limits  URINALYSIS, ROUTINE W REFLEX MICROSCOPIC - Abnormal; Notable for the following components:   Color, Urine AMBER (*)    APPearance HAZY (*)    Hgb urine dipstick LARGE (*)    Ketones, ur 5 (*)    Protein, ur 100 (*)    Leukocytes, UA LARGE (*)    WBC, UA >50 (*)    All other components within normal limits  GC/CHLAMYDIA PROBE AMP ([place]) NOT AT [facility]    EKG None  Radiology No results found.  Procedures Procedures (including critical care time)  Medications Ordered in ED Medications  sodium chloride 0.9 % bolus 1,000 mL (has no administration in time range)  ketorolac (TORADOL) 30 MG/ML injection 30 mg (has no administration in time range)  cefTRIAXone (ROCEPHIN) 1 g in sodium chloride  0.9 % 100 mL IVPB (has no administration in time range)     Initial Impression / Assessment and Plan / ED Course  I have reviewed the triage vital signs and the nursing notes.  Pertinent labs & imaging results that were available during my care of the patient were reviewed by me and considered in my medical decision making (see chart for details).  Clinical Course as of Jul 24 1149  Wed Jul 23, 2018  3637 52 year old male here with continued left testicle pain and swelling after being diagnosed with orchitis and UTI on Saturday.  His urine culture grew out E. coli that is resistant to Cipro that he was placed on.  It is sensitive to cephalosporins.  Scattered very tender swollen left testicle but he says this is not changed since Saturday.  We will give him some IV fluids check some labs and give him an IV dose of ceftriaxone.  If there is no other indications  for admission he can go home on oral Keflex.   [MB]  1845 Discussed with Dr. Jacquelyne BalintMcDermott from urology.  He feels that the patient had been untreated with the antibiotic that he was resistant to and that so he needs appropriate treatment and he can follow-up in the office with him as an outpatient.  He does not feel further imaging is necessary at this time if he does not appear clinically toxic.   [MB]  1847 Reviewed this plan with the patient and he is comfortable and will follow up with urologist outpatient or return if any worsening symptoms.   [MB]    Clinical Course User Index [MB] Terrilee FilesButler, Armas Mcbee C, MD      Final Clinical Impressions(s) / ED Diagnoses   Final diagnoses:  Epididymo-orchitis, acute    ED Discharge Orders         Ordered    cephALEXin (KEFLEX) 250 MG capsule  4 times daily     07/23/18 1855           Terrilee FilesButler, Dakai Braithwaite C, MD 07/24/18 1150

## 2018-07-24 LAB — GC/CHLAMYDIA PROBE AMP (~~LOC~~) NOT AT ARMC
Chlamydia: NEGATIVE
Neisseria Gonorrhea: NEGATIVE

## 2018-07-25 ENCOUNTER — Encounter (HOSPITAL_COMMUNITY): Payer: Self-pay

## 2018-07-25 ENCOUNTER — Observation Stay (HOSPITAL_COMMUNITY)
Admission: EM | Admit: 2018-07-25 | Discharge: 2018-07-27 | Disposition: A | Discharge status: Home / Self Care | Payer: Self-pay | Attending: Urology | Admitting: Urology

## 2018-07-25 ENCOUNTER — Emergency Department (HOSPITAL_COMMUNITY): Payer: Self-pay

## 2018-07-25 DIAGNOSIS — J45909 Unspecified asthma, uncomplicated: Secondary | ICD-10-CM | POA: Insufficient documentation

## 2018-07-25 DIAGNOSIS — Z8249 Family history of ischemic heart disease and other diseases of the circulatory system: Secondary | ICD-10-CM | POA: Insufficient documentation

## 2018-07-25 DIAGNOSIS — K5730 Diverticulosis of large intestine without perforation or abscess without bleeding: Secondary | ICD-10-CM | POA: Insufficient documentation

## 2018-07-25 DIAGNOSIS — I4581 Long QT syndrome: Secondary | ICD-10-CM | POA: Insufficient documentation

## 2018-07-25 DIAGNOSIS — Z833 Family history of diabetes mellitus: Secondary | ICD-10-CM | POA: Insufficient documentation

## 2018-07-25 DIAGNOSIS — I10 Essential (primary) hypertension: Secondary | ICD-10-CM | POA: Insufficient documentation

## 2018-07-25 DIAGNOSIS — R002 Palpitations: Secondary | ICD-10-CM | POA: Insufficient documentation

## 2018-07-25 DIAGNOSIS — N452 Orchitis: Secondary | ICD-10-CM

## 2018-07-25 DIAGNOSIS — R51 Headache: Secondary | ICD-10-CM | POA: Insufficient documentation

## 2018-07-25 DIAGNOSIS — Z880 Allergy status to penicillin: Secondary | ICD-10-CM | POA: Insufficient documentation

## 2018-07-25 DIAGNOSIS — N451 Epididymitis: Principal | ICD-10-CM | POA: Insufficient documentation

## 2018-07-25 DIAGNOSIS — Z79899 Other long term (current) drug therapy: Secondary | ICD-10-CM | POA: Insufficient documentation

## 2018-07-25 DIAGNOSIS — F17210 Nicotine dependence, cigarettes, uncomplicated: Secondary | ICD-10-CM | POA: Insufficient documentation

## 2018-07-25 HISTORY — DX: Unspecified asthma, uncomplicated: J45.909

## 2018-07-25 LAB — I-STAT TROPONIN, ED: Troponin i, poc: 0 ng/mL (ref 0.00–0.08)

## 2018-07-25 LAB — CBC WITH DIFFERENTIAL/PLATELET
Basophils Absolute: 0 10*3/uL (ref 0.0–0.1)
Basophils Relative: 0 %
Eosinophils Absolute: 0 10*3/uL (ref 0.0–0.7)
Eosinophils Relative: 0 %
HCT: 39.3 % (ref 39.0–52.0)
Hemoglobin: 14.4 g/dL (ref 13.0–17.0)
Lymphocytes Relative: 14 %
Lymphs Abs: 2.3 10*3/uL (ref 0.7–4.0)
MCH: 31.6 pg (ref 26.0–34.0)
MCHC: 36.6 g/dL — ABNORMAL HIGH (ref 30.0–36.0)
MCV: 86.4 fL (ref 78.0–100.0)
Monocytes Absolute: 1.3 10*3/uL — ABNORMAL HIGH (ref 0.1–1.0)
Monocytes Relative: 8 %
Neutro Abs: 12.5 10*3/uL — ABNORMAL HIGH (ref 1.7–7.7)
Neutrophils Relative %: 78 %
Platelets: 355 10*3/uL (ref 150–400)
RBC: 4.55 MIL/uL (ref 4.22–5.81)
RDW: 13.6 % (ref 11.5–15.5)
WBC: 16.1 10*3/uL — ABNORMAL HIGH (ref 4.0–10.5)

## 2018-07-25 LAB — URINALYSIS, ROUTINE W REFLEX MICROSCOPIC
Bilirubin Urine: NEGATIVE
Glucose, UA: NEGATIVE mg/dL
Ketones, ur: NEGATIVE mg/dL
Nitrite: NEGATIVE
Protein, ur: 30 mg/dL — AB
Specific Gravity, Urine: 1.025 (ref 1.005–1.030)
WBC, UA: >50 WBC/hpf — ABNORMAL HIGH (ref 0–5)
pH: 5 (ref 5.0–8.0)

## 2018-07-25 LAB — BASIC METABOLIC PANEL
Anion gap: 11 (ref 5–15)
BUN: 17 mg/dL (ref 6–20)
CO2: 28 mmol/L (ref 22–32)
Calcium: 9 mg/dL (ref 8.9–10.3)
Chloride: 103 mmol/L (ref 98–111)
Creatinine, Ser: 1 mg/dL (ref 0.61–1.24)
GFR calc Af Amer: >60 mL/min (ref >60)
GFR calc non Af Amer: >60 mL/min (ref >60)
Glucose, Bld: 101 mg/dL — ABNORMAL HIGH (ref 70–99)
Potassium: 3.1 mmol/L — ABNORMAL LOW (ref 3.5–5.1)
Sodium: 142 mmol/L (ref 135–145)

## 2018-07-25 LAB — I-STAT CG4 LACTIC ACID, ED: Lactic Acid, Venous: 0.9 mmol/L (ref 0.5–1.9)

## 2018-07-25 MED ORDER — IOPAMIDOL (ISOVUE-300) INJECTION 61%
INTRAVENOUS | Status: AC
  Filled 2018-07-25: qty 100

## 2018-07-25 MED ORDER — SENNA 8.6 MG PO TABS
1.0000 | ORAL_TABLET | Freq: Two times a day (BID) | ORAL | Status: DC
  Administered 2018-07-26 (×2): 8.6 mg via ORAL
  Filled 2018-07-25 (×3): qty 1

## 2018-07-25 MED ORDER — HYDROCODONE-ACETAMINOPHEN 5-325 MG PO TABS
1.0000 | ORAL_TABLET | ORAL | Status: DC | PRN
  Administered 2018-07-26 (×2): 1 via ORAL
  Filled 2018-07-25 (×2): qty 1

## 2018-07-25 MED ORDER — IOPAMIDOL (ISOVUE-300) INJECTION 61%
100.0000 mL | Freq: Once | INTRAVENOUS | Status: AC | PRN
  Administered 2018-07-25: 100 mL via INTRAVENOUS

## 2018-07-25 MED ORDER — DEXTROSE-NACL 5-0.45 % IV SOLN
INTRAVENOUS | Status: DC
  Administered 2018-07-26: 01:00:00 via INTRAVENOUS

## 2018-07-25 MED ORDER — SODIUM CHLORIDE 0.9 % IV SOLN
1.0000 g | INTRAVENOUS | Status: DC
  Administered 2018-07-26 – 2018-07-27 (×2): 1 g via INTRAVENOUS
  Filled 2018-07-25: qty 1
  Filled 2018-07-25: qty 10

## 2018-07-25 MED ORDER — ZOLPIDEM TARTRATE 5 MG PO TABS: 5.0000 mg | ORAL_TABLET | Freq: Every evening | ORAL | Status: DC | PRN

## 2018-07-25 MED ORDER — IBUPROFEN 200 MG PO TABS: 600.0000 mg | ORAL_TABLET | Freq: Four times a day (QID) | ORAL | Status: DC | PRN

## 2018-07-25 MED ORDER — ACETAMINOPHEN 325 MG PO TABS: 650.0000 mg | ORAL_TABLET | ORAL | Status: DC | PRN

## 2018-07-25 NOTE — ED Provider Notes (Signed)
[facility]-EMERGENCY DEPT Provider Note   CSN: [id number] Arrival date & time: 07/25/18  1800     History   Chief Complaint Chief Complaint  Patient presents with  . Groin Swelling  . Allergic Reaction  . heart palpitations  . Headache    HPI Daniel Briggs is a 52 y.o. male.  Patient is a 52 year old male who presents with left testicular pain.  He was seen here on September 15 and had an ultrasound of his testicle which showed evidence of orchitis.  He had infection in his urine.  He was started on Cipro.  His urine culture grew out E. coli and he presented back here on 918 with no improvement in symptoms.  His urine cultures were susceptible to Keflex and he was started on Keflex.  His testicular swelling was not improved and his presentation was discussed with urology who felt that given he had not yet gotten appropriate antibiotic treatment that it was worthwhile to start this without reimaging.  He states that his testicles continue to get more swollen and last night started to be hard and more painful.  He has had ongoing fevers although he has not had a fever today.  He has had some ongoing nausea and vomiting although not today.  He felt like he may be having allergic reaction to the Keflex as he started having some itching in his mouth after he took the last dose of the medication and started developing some sores in his mouth.  Denies any swelling of his lips or tongue.  No shortness of breath.  He does report some palpitations and some discomfort in the left side of his chest.     Past Medical History:  Diagnosis Date  . Asthma   . Hypertension     Patient Active Problem List   Diagnosis Date Noted  . Epididymitis 07/25/2018    History reviewed. No pertinent surgical history.      Home Medications    Prior to Admission medications   Medication Sig Start Date End Date Taking? Authorizing Provider  acetaminophen (TYLENOL) 500 MG tablet  Take 1,000 mg by mouth every 6 (six) hours as needed for moderate pain.   Yes [provider]  cephALEXin (KEFLEX) 250 MG capsule Take 1 capsule (250 mg total) by mouth 4 (four) times daily. 07/23/18  Yes Terrilee FilesButler, Michael C, MD  lisinopril-hydrochlorothiazide (PRINZIDE,ZESTORETIC) 20-25 MG tablet Take 1 tablet by mouth daily.   Yes [provider]  naproxen sodium (ANAPROX) 550 MG tablet Take 550 mg by mouth 2 (two) times daily with a meal.   Yes [provider]  hydrochlorothiazide (HYDRODIURIL) 12.5 MG tablet Take 1 tablet (12.5 mg total) by mouth daily. Patient not taking: Reported on 06/02/2018 10/20/15   Phillis HaggisMabe, Martha L, MD    Family History Family History  Problem Relation Age of Onset  . Hypertension Mother   . Diabetes Mother   . Heart failure Mother   . Heart failure Father     Social History Social History   Tobacco Use  . Smoking status: Current Every Day Smoker    Packs/day: 0.25    Types: Cigarettes  . Smokeless tobacco: Never Used  Substance Use Topics  . Alcohol use: Not Currently  . Drug use: Not Currently    Types: Marijuana     Allergies   Penicillins   Review of Systems Review of Systems  Constitutional: Positive for fatigue and fever. Negative for chills and diaphoresis.  HENT: Positive for mouth sores. Negative for congestion, rhinorrhea and sneezing.   Eyes: Negative.   Respiratory: Negative for cough, chest tightness and shortness of breath.   Cardiovascular: Positive for chest pain and palpitations. Negative for leg swelling.  Gastrointestinal: Positive for nausea and vomiting. Negative for abdominal pain, blood in stool and diarrhea.  Genitourinary: Positive for scrotal swelling and testicular pain. Negative for difficulty urinating, flank pain, frequency and hematuria.  Musculoskeletal: Negative for arthralgias and back pain.  Skin: Negative for rash.  Neurological: Negative for dizziness, speech difficulty, weakness,  numbness and headaches.     Physical Exam Updated Vital Signs BP (!) 156/98 (BP Location: Left Arm)   Pulse 92   Temp 99.6 F (37.6 C) (Oral)   Resp 16   Ht 5\' 8"  (1.727 m)   Wt 98.4 kg   SpO2 96%   BMI 32.99 kg/m   Physical Exam  Constitutional: He is oriented to person, place, and time. He appears well-developed and well-nourished.  HENT:  Head: Normocephalic and atraumatic.  There are some tiny ulcers to the left upper gum  Eyes: Pupils are equal, round, and reactive to light.  Neck: Normal range of motion. Neck supple.  Cardiovascular: Normal rate, regular rhythm and normal heart sounds.  Pulmonary/Chest: Effort normal and breath sounds normal. No respiratory distress. He has no wheezes. He has no rales. He exhibits no tenderness.  Abdominal: Soft. Bowel sounds are normal. There is no tenderness. There is no rebound and no guarding.  Genitourinary:  Genitourinary Comments: There is diffuse swelling to the left scrotum/testicle which is hard and tender the touch.  There is no discrete abscess palpated.  He has some tenderness that goes into the buttocks cheeks bilaterally but I do not see any swelling or redness in these areas.  Musculoskeletal: Normal range of motion. He exhibits no edema.  Lymphadenopathy:    He has no cervical adenopathy.  Neurological: He is alert and oriented to person, place, and time.  Skin: Skin is warm and dry. No rash noted.  Psychiatric: He has a normal mood and affect.     ED Treatments / Results  Labs (all labs ordered are listed, but only abnormal results are displayed) Labs Reviewed  BASIC METABOLIC PANEL - Abnormal; Notable for the following components:      Result Value   Potassium 3.1 (*)    Glucose, Bld 101 (*)    All other components within normal limits  CBC WITH DIFFERENTIAL/PLATELET - Abnormal; Notable for the following components:   WBC 16.1 (*)    MCHC 36.6 (*)    Neutro Abs 12.5 (*)    Monocytes Absolute 1.3 (*)    All  other components within normal limits  URINALYSIS, ROUTINE W REFLEX MICROSCOPIC - Abnormal; Notable for the following components:   APPearance HAZY (*)    Hgb urine dipstick LARGE (*)    Protein, ur 30 (*)    Leukocytes, UA LARGE (*)    WBC, UA >50 (*)    Bacteria, UA RARE (*)    All other components within normal limits  I-STAT CG4 LACTIC ACID, ED  I-STAT TROPONIN, ED  I-STAT CG4 LACTIC ACID, ED    EKG EKG Interpretation  Date/Time:  Friday July 25 2018 18:23:51 EDT Ventricular Rate:  118 PR Interval:    QRS Duration: 87 QT Interval:  383 QTC Calculation: 537 R Axis:   37 Text Interpretation:  Sinus tachycardia Left atrial enlargement Borderline repolarization abnormality Prolonged QT interval  Confirmed by Rolan Bucco [phone]) on 07/25/2018 8:57:44 PM   Radiology Ct Pelvis W Contrast  Result Date: 07/25/2018 CLINICAL DATA:  Testicular swelling 2 days ago, question scrotal mass or lump. EXAM: CT PELVIS WITH CONTRAST TECHNIQUE: Multidetector CT imaging of the pelvis was performed using the standard protocol following the bolus administration of intravenous contrast. CONTRAST:  ISOVUE-300 IOPAMIDOL (ISOVUE-300) INJECTION 61% COMPARISON:  None. FINDINGS: Urinary Tract: Thick-walled appearance of the urinary bladder raising concern for possible cystitis though this may simply be due to underdistention. No hydroureteronephrosis nor genitourinary calculi identified within the pelvis. Bowel: Normal appearing appendix. Scattered colonic diverticulosis along the left colon without acute diverticulitis. Vascular/Lymphatic: No pathologically enlarged lymph nodes. No significant vascular abnormality seen. Reproductive: Normal size prostate and seminal vesicles. Tiny hypodensities within the seminal vesicles may represent small seminal vesicle cysts. Other: No free air nor free fluid. Both testicles are visualized within the scrotal sac. The scrotum is thickened and edematous in  appearance and may represent scrotal cellulitis. No soft tissue emphysema to suggest Fournier gangrene. Trace fluid about the left testicle with subtle 17 x 7 mm hypodensity associated with the anterior aspect of the left testicle of uncertain etiology. A tunica albuginea cyst is a possibility although intratesticular abscess, testicular cyst, hypodense mass or perfusion anomaly are not entirely excluded. Hypervascular slightly engorged vessels within the left hemiscrotum are noted suggesting inflammatory change. Consider testicular ultrasound better assessment. Musculoskeletal: Nonacute IMPRESSION: 1. Diffuse scrotal thickening and edema without soft tissue emphysema. Findings likely represent a cellulitis either postinfectious or inflammatory. Fournier gangrene is believed less likely given lack of soft tissue emphysema. 2. Slight heterogeneous enhancement of the left testicular parenchyma with a subtle 17 x 8 mm ovoid hypodensity associated with the left testicle. Ultrasound correlation is suggested. Differential possibilities as above. 3. Thick-walled urinary bladder more likely due to underdistention. Cystitis is not excluded. Electronically Signed   By: Tollie Eth M.D.   On: 07/25/2018 22:55    Procedures Procedures (including critical care time)  Medications Ordered in ED Medications  iopamidol (ISOVUE-300) 61 % injection (has no administration in time range)  dextrose 5 %-0.45 % sodium chloride infusion (has no administration in time range)  cefTRIAXone (ROCEPHIN) 1 g in sodium chloride 0.9 % 100 mL IVPB (has no administration in time range)  acetaminophen (TYLENOL) tablet 650 mg (has no administration in time range)  HYDROcodone-acetaminophen (NORCO/VICODIN) 5-325 MG per tablet 1-2 tablet (has no administration in time range)  senna (SENOKOT) tablet 8.6 mg (has no administration in time range)  zolpidem (AMBIEN) tablet 5 mg (has no administration in time range)  ibuprofen (ADVIL,MOTRIN)  tablet 600 mg (has no administration in time range)  iopamidol (ISOVUE-300) 61 % injection 100 mL (100 mLs Intravenous Contrast Given 07/25/18 2223)     Initial Impression / Assessment and Plan / ED Course  I have reviewed the triage vital signs and the nursing notes.  Pertinent labs & imaging results that were available during my care of the patient were reviewed by me and considered in my medical decision making (see chart for details).     Patient is a 52 year old male who presents with increased swelling of his left scrotum.  CT scan shows diffuse edema of the left scrotum consistent with probable cellulitis.  His W BC count is still elevated although better than his last counts.  He is currently afebrile but is been running fevers over the last several days.  I spoke with Dr. Hillis Range with urology  who will admit the patient for IV antibiotics.  Final Clinical Impressions(s) / ED Diagnoses   Final diagnoses:  Orchitis    ED Discharge Orders    None       Rolan Bucco, MD 07/26/18 [phone]

## 2018-07-25 NOTE — ED Triage Notes (Signed)
Patient reports that he had testicular swelling 2 days ago. Patient states since taking the medication he has had heart palpitations, headache,burning in his mouth, and increased swelling of the testicles.

## 2018-07-26 ENCOUNTER — Encounter (HOSPITAL_COMMUNITY): Payer: Self-pay

## 2018-07-26 ENCOUNTER — Other Ambulatory Visit: Payer: Self-pay

## 2018-07-26 MED ORDER — KETOROLAC TROMETHAMINE 30 MG/ML IJ SOLN
30.0000 mg | Freq: Three times a day (TID) | INTRAMUSCULAR | Status: DC
  Administered 2018-07-26 – 2018-07-27 (×4): 30 mg via INTRAVENOUS
  Filled 2018-07-26 (×4): qty 1

## 2018-07-26 MED ORDER — NICOTINE 21 MG/24HR TD PT24
21.0000 mg | MEDICATED_PATCH | TRANSDERMAL | Status: DC
  Administered 2018-07-26: 21 mg via TRANSDERMAL
  Filled 2018-07-26: qty 1

## 2018-07-26 NOTE — H&P (Signed)
H&P  Chief Complaint: Left testicular pain/swelling  History of Present Illness: 52 year old male without prior urologic history is admitted at this time for management of acute epididymitis on the left.  He initially had symptoms of dysuria, frequency and urgency about a week ago.  He presented 7 days ago to the emergency room where he was found to have left testicular swelling and would look like a urinary tract infection.  He was initially treated with Cipro.  He was discharged home from the emergency room.  Urine culture grew E. coli resistant to the Cipro, and he was put on Keflex and then doxycycline more recently.  He has had 2 other visits to the emergency room, most recently late last night with persistent pain and swelling, although leukocytosis had improved.  Because of persistent symptoms with subjective fevers, the patient is admitted for further management.  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  Process seems to be localized to the scrotum.  Past Medical History:  Diagnosis Date  . Asthma   . Hypertension     History reviewed. No pertinent surgical history.  Home Medications:    Allergies:  Allergies  Allergen Reactions  . Penicillins Hives    Has patient had a PCN reaction causing immediate rash, facial/tongue/throat swelling, SOB or lightheadedness with hypotension: Yes Has patient had a PCN reaction causing severe rash involving mucus membranes or skin necrosis: No Has patient had a PCN reaction that required hospitalization: No Has patient had a PCN reaction occurring within the last 10 years: No If all of the above answers are "NO", then may proceed with Cephalosporin use.     Family History  Problem Relation Age of Onset  . Hypertension Mother   . Diabetes Mother   . Heart failure Mother   . Heart failure Father     Social History:  reports that he has been smoking cigarettes. He has been smoking about  0.25 packs per day. He has never used smokeless tobacco. He reports that he drank alcohol. He reports that he has current or past drug history. Drug: Marijuana.  ROS: A complete review of systems was performed.  All systems are negative except for pertinent findings as noted.  Physical Exam:  Vital signs in last 24 hours: Temp:  [98.8 F (37.1 C)-99.6 F (37.6 C)] 98.8 F (37.1 C) (09/21 0427) Pulse Rate:  [77-120] 77 (09/21 0427) Resp:  [16-18] 16 (09/21 0427) BP: (107-156)/(67-98) 111/67 (09/21 0427) SpO2:  [92 %-97 %] 92 % (09/21 0427) Weight:  [98.4 kg] 98.4 kg (09/20 1820) Constitutional:  Alert and oriented, No acute distress Cardiovascular: Regular rate  Respiratory: Normal respiratory effort GI: Abdomen is soft, nontender, nondistended, no abdominal masses Genitourinary: Phallus normal.  Right hemiscrotum basically normal, left is edematous.  Left testicle and epididymis is significantly enlarged, tender.  No subcutaneous emphysema. Lymphatic: No lymphadenopathy Neurologic: Grossly intact, no focal deficits Psychiatric: Normal mood and affect  Laboratory Data:  Recent Labs    07/23/18 1714 07/25/18 2108  WBC 27.4* 16.1*  HGB 15.5 14.4  HCT 41.8 39.3  PLT 319 355    Recent Labs    07/23/18 1714 07/25/18 2108  NA 141 142  K 3.6 3.1*  CL 100 103  GLUCOSE 130* 101*  BUN 14 17  CALCIUM 9.4 9.0  CREATININE 1.14 1.00     Results for orders placed or performed during the hospital encounter of 07/25/18 (from the past 24 hour(s))  Basic metabolic  panel     Status: Abnormal   Collection Time: 07/25/18  9:08 PM  Result Value Ref Range   Sodium 142 135 - 145 mmol/L   Potassium 3.1 (L) 3.5 - 5.1 mmol/L   Chloride 103 98 - 111 mmol/L   CO2 28 22 - 32 mmol/L   Glucose, Bld 101 (H) 70 - 99 mg/dL   BUN 17 6 - 20 mg/dL   Creatinine, Ser 4.091.00 0.61 - 1.24 mg/dL   Calcium 9.0 8.9 - 81.110.3 mg/dL   GFR calc non Af Amer >60 >60 mL/min   GFR calc Af Amer >60 >60 mL/min    Anion gap 11 5 - 15  CBC with Differential     Status: Abnormal   Collection Time: 07/25/18  9:08 PM  Result Value Ref Range   WBC 16.1 (H) 4.0 - 10.5 K/uL   RBC 4.55 4.22 - 5.81 MIL/uL   Hemoglobin 14.4 13.0 - 17.0 g/dL   HCT 91.439.3 78.239.0 - 95.652.0 %   MCV 86.4 78.0 - 100.0 fL   MCH 31.6 26.0 - 34.0 pg   MCHC 36.6 (H) 30.0 - 36.0 g/dL   RDW 21.313.6 08.611.5 - 57.815.5 %   Platelets 355 150 - 400 K/uL   Neutrophils Relative % 78 %   Lymphocytes Relative 14 %   Monocytes Relative 8 %   Eosinophils Relative 0 %   Basophils Relative 0 %   Neutro Abs 12.5 (H) 1.7 - 7.7 K/uL   Lymphs Abs 2.3 0.7 - 4.0 K/uL   Monocytes Absolute 1.3 (H) 0.1 - 1.0 K/uL   Eosinophils Absolute 0.0 0.0 - 0.7 K/uL   Basophils Absolute 0.0 0.0 - 0.1 K/uL   Smear Review MORPHOLOGY UNREMARKABLE   I-stat troponin, ED     Status: None   Collection Time: 07/25/18  9:14 PM  Result Value Ref Range   Troponin i, poc 0.00 0.00 - 0.08 ng/mL   Comment 3          I-Stat CG4 Lactic Acid, ED     Status: None   Collection Time: 07/25/18  9:15 PM  Result Value Ref Range   Lactic Acid, Venous 0.90 0.5 - 1.9 mmol/L  Urinalysis, Routine w reflex microscopic     Status: Abnormal   Collection Time: 07/25/18 10:34 PM  Result Value Ref Range   Color, Urine YELLOW YELLOW   APPearance HAZY (A) CLEAR   Specific Gravity, Urine 1.025 1.005 - 1.030   pH 5.0 5.0 - 8.0   Glucose, UA NEGATIVE NEGATIVE mg/dL   Hgb urine dipstick LARGE (A) NEGATIVE   Bilirubin Urine NEGATIVE NEGATIVE   Ketones, ur NEGATIVE NEGATIVE mg/dL   Protein, ur 30 (A) NEGATIVE mg/dL   Nitrite NEGATIVE NEGATIVE   Leukocytes, UA LARGE (A) NEGATIVE   RBC / HPF 11-20 0 - 5 RBC/hpf   WBC, UA >50 (H) 0 - 5 WBC/hpf   Bacteria, UA RARE (A) NONE SEEN   Squamous Epithelial / LPF 0-5 0 - 5   Mucus PRESENT    Recent Results (from the past 240 hour(s))  Urine culture     Status: Abnormal   Collection Time: 07/20/18  1:25 PM  Result Value Ref Range Status   Specimen Description  URINE, CLEAN CATCH  Final   Special Requests   Final    NONE Performed at [facility], [address] [place], [place] [id number]    Culture >=100,000 COLONIES/mL ESCHERICHIA COLI (A)  Final  Report Status 07/22/2018 FINAL  Final   Organism ID, Bacteria ESCHERICHIA COLI (A)  Final      Susceptibility   Escherichia coli - MIC*    AMPICILLIN >=32 RESISTANT Resistant     CEFAZOLIN <=4 SENSITIVE Sensitive     CEFTRIAXONE <=1 SENSITIVE Sensitive     CIPROFLOXACIN >=4 RESISTANT Resistant     GENTAMICIN <=1 SENSITIVE Sensitive     IMIPENEM <=0.25 SENSITIVE Sensitive     NITROFURANTOIN <=16 SENSITIVE Sensitive     TRIMETH/SULFA >=320 RESISTANT Resistant     AMPICILLIN/SULBACTAM 16 INTERMEDIATE Intermediate     PIP/TAZO <=4 SENSITIVE Sensitive     Extended ESBL NEGATIVE Sensitive     * >=100,000 COLONIES/mL ESCHERICHIA COLI    Renal Function: Recent Labs    07/23/18 1714 07/25/18 2108  CREATININE 1.14 1.00   Estimated Creatinine Clearance: 99.4 mL/min (by C-G formula based on SCr of 1 mg/dL).  Radiologic Imaging: Ct Pelvis W Contrast  Result Date: 07/25/2018 CLINICAL DATA:  Testicular swelling 2 days ago, question scrotal mass or lump. EXAM: CT PELVIS WITH CONTRAST TECHNIQUE: Multidetector CT imaging of the pelvis was performed using the standard protocol following the bolus administration of intravenous contrast. CONTRAST:  ISOVUE-300 IOPAMIDOL (ISOVUE-300) INJECTION 61% COMPARISON:  None. FINDINGS: Urinary Tract: Thick-walled appearance of the urinary bladder raising concern for possible cystitis though this may simply be due to underdistention. No hydroureteronephrosis nor genitourinary calculi identified within the pelvis. Bowel: Normal appearing appendix. Scattered colonic diverticulosis along the left colon without acute diverticulitis. Vascular/Lymphatic: No pathologically enlarged lymph nodes. No significant vascular abnormality seen. Reproductive: Normal size  prostate and seminal vesicles. Tiny hypodensities within the seminal vesicles may represent small seminal vesicle cysts. Other: No free air nor free fluid. Both testicles are visualized within the scrotal sac. The scrotum is thickened and edematous in appearance and may represent scrotal cellulitis. No soft tissue emphysema to suggest Fournier gangrene. Trace fluid about the left testicle with subtle 17 x 7 mm hypodensity associated with the anterior aspect of the left testicle of uncertain etiology. A tunica albuginea cyst is a possibility although intratesticular abscess, testicular cyst, hypodense mass or perfusion anomaly are not entirely excluded. Hypervascular slightly engorged vessels within the left hemiscrotum are noted suggesting inflammatory change. Consider testicular ultrasound better assessment. Musculoskeletal: Nonacute IMPRESSION: 1. Diffuse scrotal thickening and edema without soft tissue emphysema. Findings likely represent a cellulitis either postinfectious or inflammatory. Fournier gangrene is believed less likely given lack of soft tissue emphysema. 2. Slight heterogeneous enhancement of the left testicular parenchyma with a subtle 17 x 8 mm ovoid hypodensity associated with the left testicle. Ultrasound correlation is suggested. Differential possibilities as above. 3. Thick-walled urinary bladder more likely due to underdistention. Cystitis is not excluded. Electronically Signed   By: Tollie Eth M.D.   On: 07/25/2018 22:55   CT and ultrasound images were independently reviewed.  Additionally, laboratories were reviewed. Impression/Assessment:  Epididymitis, significant but seemingly improving.  Plan:  1.  Continue Rocephin.  I have added Toradol which should significantly improve his pain and inflammation  2.  I will continue to watch him, add heat and elevation to his testicle.

## 2018-07-26 NOTE — Progress Notes (Signed)
Mesh underwear and heat pack with washcloth given to pt. Pt applied himself and wrapped the heat pack with the washcloth per RN recommendation. Pt states this is helping his pain.

## 2018-07-27 ENCOUNTER — Encounter (HOSPITAL_COMMUNITY): Payer: Self-pay

## 2018-07-27 NOTE — Progress Notes (Signed)
Patient discharged home with family. Printed paperwork explained to pt who verbalized understanding. No new medications prescribed. Pt preferred to ambulate to exit with family. All belongings sent with pt.

## 2018-07-27 NOTE — Discharge Summary (Signed)
Patient ID: Daniel PrinceHenry B Briggs MRN: [id number] DOB/AGE: [birth year] 51 y.o.  Admit date: 07/25/2018 Discharge date: 07/27/2018  Primary Care Physician:  Lewis Moccasinewey, Elizabeth R, MD  Discharge Diagnoses: Epididymitis, acute Present on Admission: . Epididymitis   Consults:  None     Discharge Medications: Allergies as of 07/27/2018      Reactions   Penicillins Hives   Has patient had a PCN reaction causing immediate rash, facial/tongue/throat swelling, SOB or lightheadedness with hypotension: Yes Has patient had a PCN reaction causing severe rash involving mucus membranes or skin necrosis: No Has patient had a PCN reaction that required hospitalization: No Has patient had a PCN reaction occurring within the last 10 years: No If all of the above answers are "NO", then may proceed with Cephalosporin use.      Medication List    TAKE these medications   acetaminophen 500 MG tablet Commonly known as:  TYLENOL Take 1,000 mg by mouth every 6 (six) hours as needed for moderate pain.   cephALEXin 250 MG capsule Commonly known as:  KEFLEX Take 1 capsule (250 mg total) by mouth 4 (four) times daily.   hydrochlorothiazide 12.5 MG tablet Commonly known as:  HYDRODIURIL Take 1 tablet (12.5 mg total) by mouth daily.   lisinopril-hydrochlorothiazide 20-25 MG tablet Commonly known as:  PRINZIDE,ZESTORETIC Take 1 tablet by mouth daily.   naproxen sodium 550 MG tablet Commonly known as:  ANAPROX Take 550 mg by mouth 2 (two) times daily with a meal.        Significant Diagnostic Studies:  Ct Pelvis W Contrast  Result Date: 07/25/2018 CLINICAL DATA:  Testicular swelling 2 days ago, question scrotal mass or lump. EXAM: CT PELVIS WITH CONTRAST TECHNIQUE: Multidetector CT imaging of the pelvis was performed using the standard protocol following the bolus administration of intravenous contrast. CONTRAST:  100mL ISOVUE-300 IOPAMIDOL (ISOVUE-300) INJECTION 61% COMPARISON:  None. FINDINGS: Urinary  Tract: Thick-walled appearance of the urinary bladder raising concern for possible cystitis though this may simply be due to underdistention. No hydroureteronephrosis nor genitourinary calculi identified within the pelvis. Bowel: Normal appearing appendix. Scattered colonic diverticulosis along the left colon without acute diverticulitis. Vascular/Lymphatic: No pathologically enlarged lymph nodes. No significant vascular abnormality seen. Reproductive: Normal size prostate and seminal vesicles. Tiny hypodensities within the seminal vesicles may represent small seminal vesicle cysts. Other: No free air nor free fluid. Both testicles are visualized within the scrotal sac. The scrotum is thickened and edematous in appearance and may represent scrotal cellulitis. No soft tissue emphysema to suggest Fournier gangrene. Trace fluid about the left testicle with subtle 17 x 7 mm hypodensity associated with the anterior aspect of the left testicle of uncertain etiology. A tunica albuginea cyst is a possibility although intratesticular abscess, testicular cyst, hypodense mass or perfusion anomaly are not entirely excluded. Hypervascular slightly engorged vessels within the left hemiscrotum are noted suggesting inflammatory change. Consider testicular ultrasound better assessment. Musculoskeletal: Nonacute IMPRESSION: 1. Diffuse scrotal thickening and edema without soft tissue emphysema. Findings likely represent a cellulitis either postinfectious or inflammatory. Fournier gangrene is believed less likely given lack of soft tissue emphysema. 2. Slight heterogeneous enhancement of the left testicular parenchyma with a subtle 17 x 8 mm ovoid hypodensity associated with the left testicle. Ultrasound correlation is suggested. Differential possibilities as above. 3. Thick-walled urinary bladder more likely due to underdistention. Cystitis is not excluded. Electronically Signed   By: Tollie Ethavid  Kwon M.D.   On: 07/25/2018 22:55     Brief H and P:  For complete details please refer to admission H and P, but in brief the patient was admitted for further management of epididymitis that has continued to faster despite outpatient antibiotic management.  Hospital Course: He was admitted for IV antibiotics.  He was put on Rocephin, and anti-inflammatory agents were added.  He had rapid improvement, decreased swelling and much less pain.  Was discharged on hospital day #2.  At this point, he was improved condition. Active Problems:   Epididymitis   Day of Discharge BP (!) 134/95 (BP Location: Right Arm)   Pulse 66   Temp 98.2 F (36.8 C) (Oral)   Resp 18   Ht 5\' 8"  (1.727 m)   Wt 98.4 kg   SpO2 97%   BMI 32.99 kg/m   No results found for this or any previous visit (from the past 24 hour(s)).  Physical Exam: General: Alert and awake oriented x3 not in any acute distress. HEENT: anicteric sclera, pupils reactive to light and accommodation CVS: S1-S2 clear no murmur rubs or gallops Chest: clear to auscultation bilaterally, no wheezing rales or rhonchi Abdomen: soft nontender, nondistended, normal bowel sounds, no organomegaly Extremities: no cyanosis, clubbing or edema noted bilaterally Neuro: Cranial nerves II-XII intact, no focal neurological deficits  Disposition: Home  Diet: Regular  Activity: Increase as tolerated   Disposition and Follow-up:  He will follow-up in 1 month  TESTS THAT NEED FOLLOW-UP None  DISCHARGE FOLLOW-UP Follow-up Information    [facility] Follow up.   Why:  call for followup appt to be seen in 1 month Contact information: [address] [place] [phone]          Time spent on Discharge: 15 minutes  Signed: Bertram Millard Zohar Maroney 07/27/2018, 8:09 AM

## 2018-07-27 NOTE — Discharge Instructions (Signed)
1.  Continue to use scrotal support/tight briefs  2.  You  will feel better if he soak in a hot tub once in a while  3.  It looks like you have been taking naproxen at home--this works as well as ibuprofen.  You can take that twice a day.  If you do not have any at home, take ibuprofen 200 mg tablets, 3 tablets up to every 8 hours  4.  You should have a prescription for cephalexin at home.  This is as good as the doxycycline for your infection, and it is basically what you were taking in your vein in the hospital.  Take 1 cephalexin capsule 4 times a day for at least 7 days  5.  If you are not continuing to improve or see yourself getting worse, call our office to be seen sooner than 1 month follow-up.

## 2018-12-16 DIAGNOSIS — K219 Gastro-esophageal reflux disease without esophagitis: Secondary | ICD-10-CM | POA: Diagnosis not present

## 2018-12-16 DIAGNOSIS — B353 Tinea pedis: Secondary | ICD-10-CM | POA: Diagnosis not present

## 2018-12-16 DIAGNOSIS — I10 Essential (primary) hypertension: Secondary | ICD-10-CM | POA: Diagnosis not present

## 2018-12-16 DIAGNOSIS — Z6834 Body mass index (BMI) 34.0-34.9, adult: Secondary | ICD-10-CM | POA: Diagnosis not present

## 2019-02-18 DIAGNOSIS — B356 Tinea cruris: Secondary | ICD-10-CM | POA: Diagnosis not present

## 2019-02-18 DIAGNOSIS — Z719 Counseling, unspecified: Secondary | ICD-10-CM | POA: Diagnosis not present

## 2019-03-06 DIAGNOSIS — I10 Essential (primary) hypertension: Secondary | ICD-10-CM | POA: Diagnosis not present

## 2019-03-06 DIAGNOSIS — B356 Tinea cruris: Secondary | ICD-10-CM | POA: Diagnosis not present

## 2019-03-06 DIAGNOSIS — Z720 Tobacco use: Secondary | ICD-10-CM | POA: Diagnosis not present

## 2019-03-06 DIAGNOSIS — E663 Overweight: Secondary | ICD-10-CM | POA: Diagnosis not present

## 2019-04-02 DIAGNOSIS — F17200 Nicotine dependence, unspecified, uncomplicated: Secondary | ICD-10-CM | POA: Diagnosis not present

## 2019-04-02 DIAGNOSIS — M549 Dorsalgia, unspecified: Secondary | ICD-10-CM | POA: Diagnosis not present

## 2019-04-02 DIAGNOSIS — R739 Hyperglycemia, unspecified: Secondary | ICD-10-CM | POA: Diagnosis not present

## 2019-04-02 DIAGNOSIS — I10 Essential (primary) hypertension: Secondary | ICD-10-CM | POA: Diagnosis not present

## 2019-04-12 IMAGING — CT CT PELVIS W/ CM
2 of 3 series · 16 of 46 positions shown, 18 images · IV contrast (ISOVUE)
Comparison: None.

CLINICAL DATA: Testicular swelling 2 days ago, question scrotal
mass or lump.

EXAM:
CT PELVIS WITH CONTRAST
TECHNIQUE: Multidetector CT imaging of the pelvis was performed using the
standard protocol following the bolus administration of intravenous
contrast.
CONTRAST:  100mL HRI03J-2CC IOPAMIDOL (HRI03J-2CC) INJECTION 61%

[Series 3: axial st · axial · 0.96mm/px · z∈[+1083,+1423]mm · 13 of 80 slices shown, 15 images]
[im 6/80  soft-tissue]
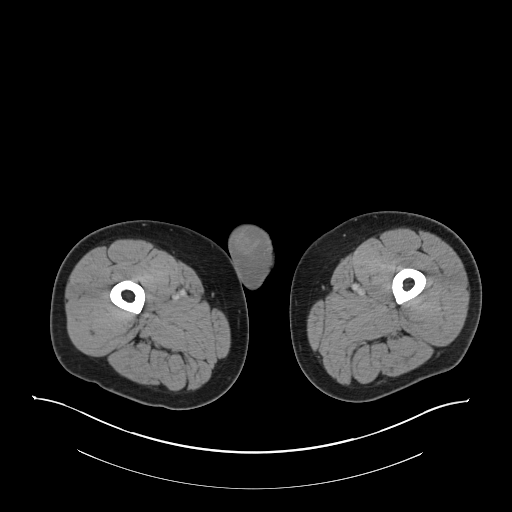
[im 6/80  bone]
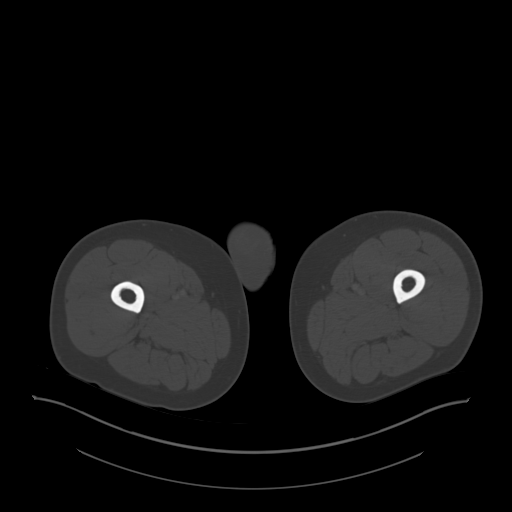
[im 11/80  soft-tissue]
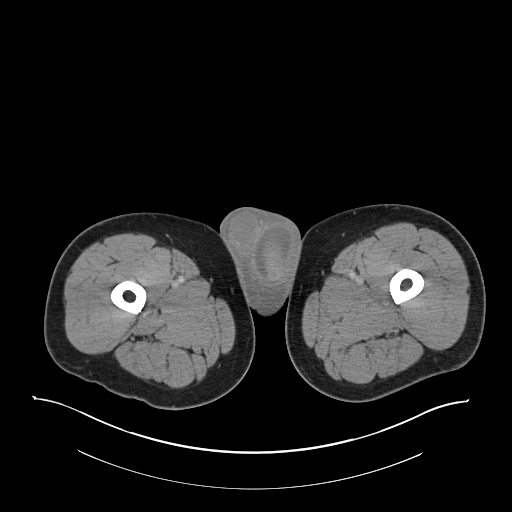
[im 16/80  soft-tissue]
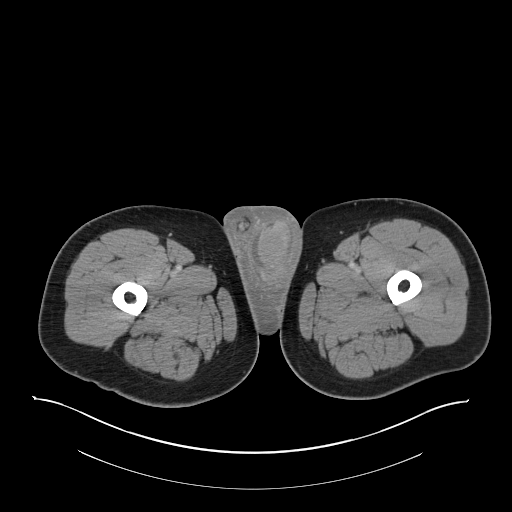
[im 23/80  soft-tissue]
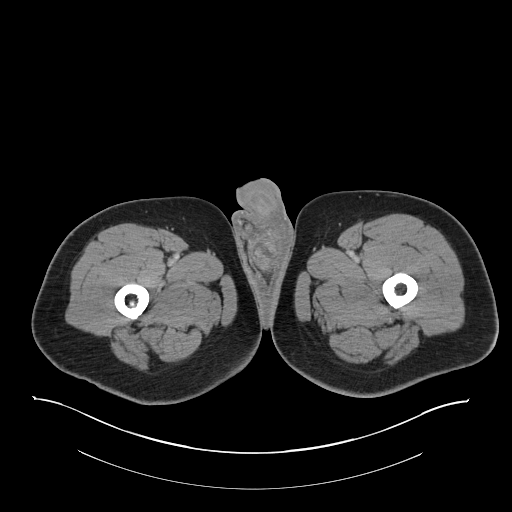
[im 29/80  soft-tissue]
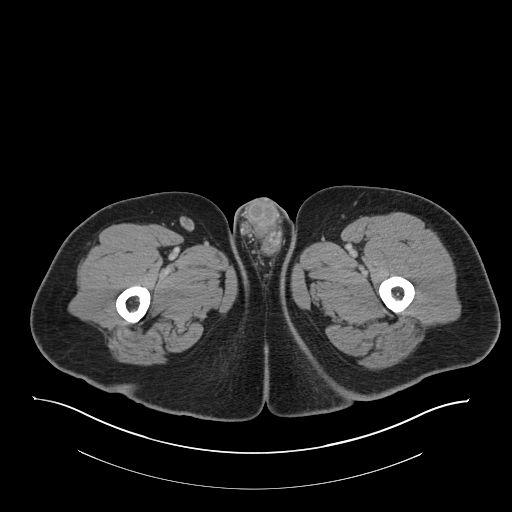
[im 34/80  soft-tissue]
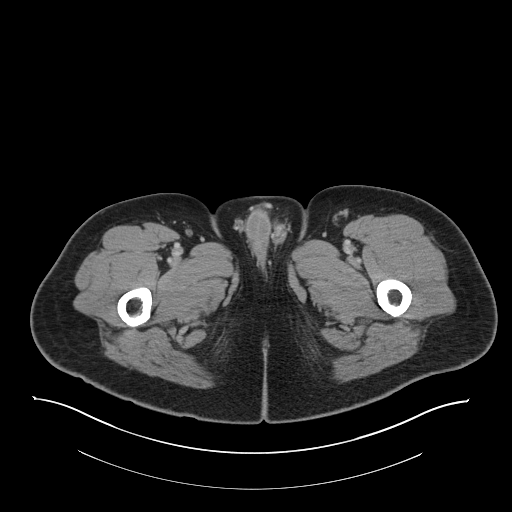
[im 41/80  soft-tissue]
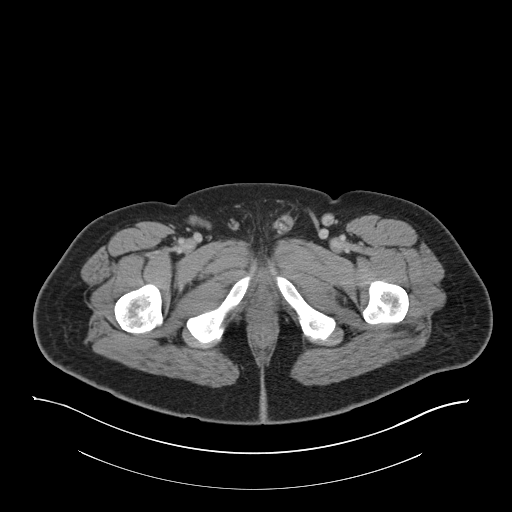
[im 46/80  soft-tissue]
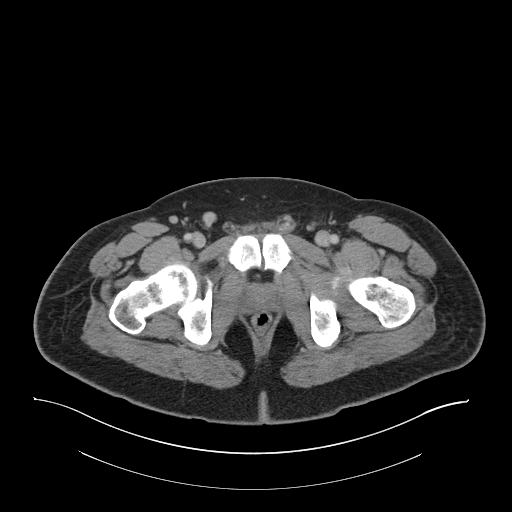
[im 51/80  soft-tissue]
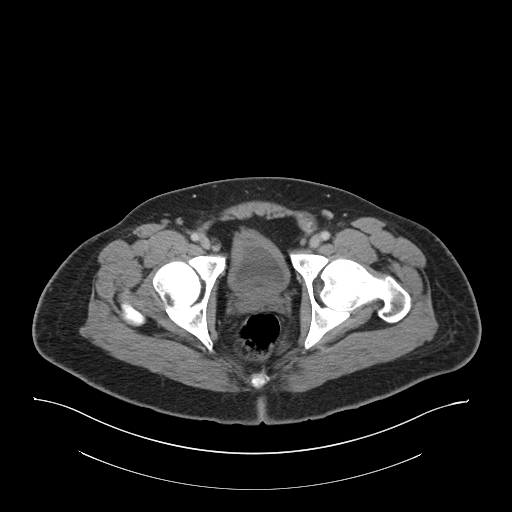
[im 51/80  bone]
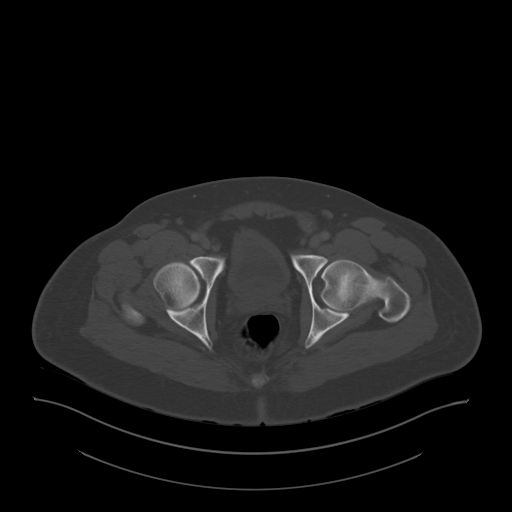
[im 57/80  soft-tissue]
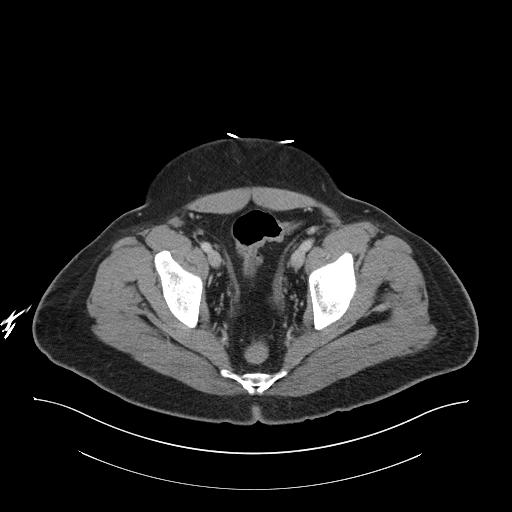
[im 64/80  soft-tissue]
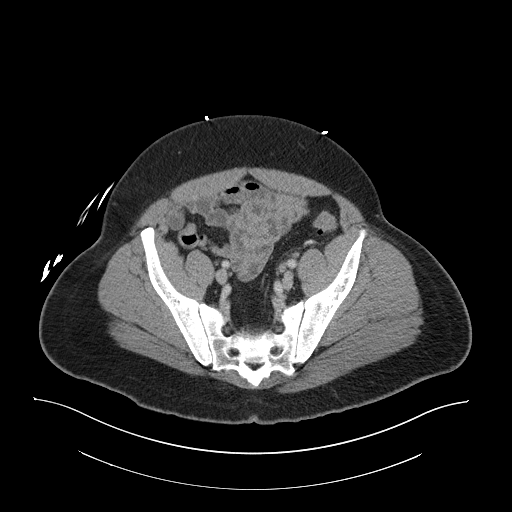
[im 69/80  soft-tissue]
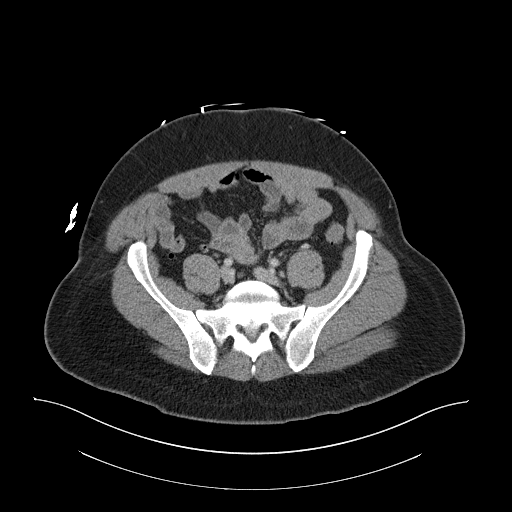
[im 74/80  soft-tissue]
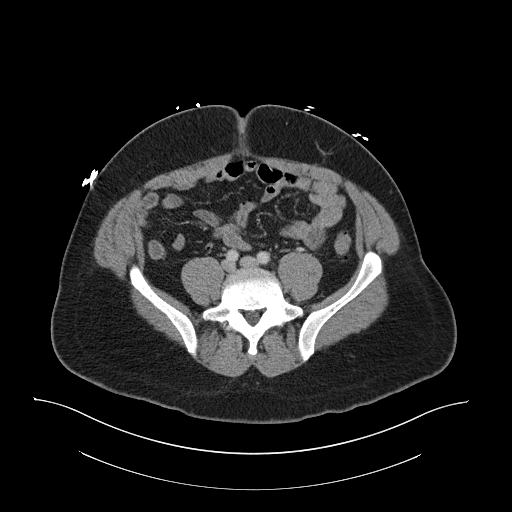

[Series 4: coronal st · coronal · 0.79mm/px · 3 of 101 slices shown]
[im 34/101  soft-tissue]
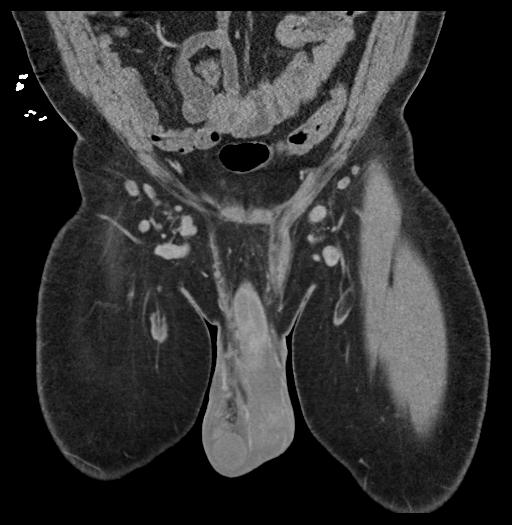
[im 45/101  soft-tissue]
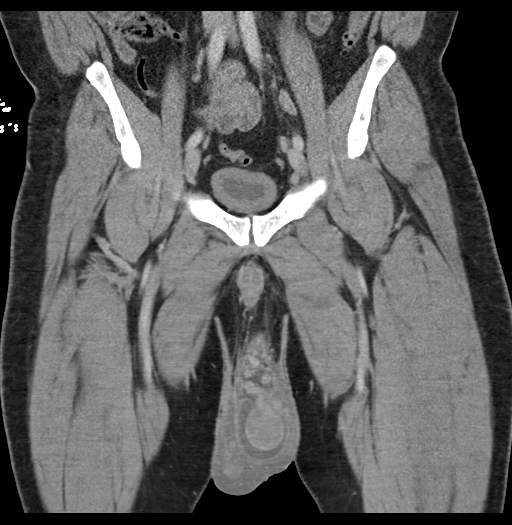
[im 56/101  soft-tissue]
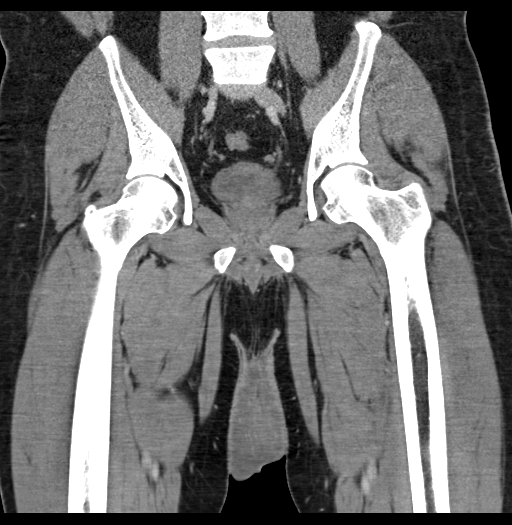

[16 of 46 positions shown; findings below may reference images not displayed]

FINDINGS: Urinary Tract: Thick-walled appearance of the urinary bladder
raising concern for possible cystitis though this may simply be due
to underdistention. No hydroureteronephrosis nor genitourinary
calculi identified within the pelvis.

Bowel: Normal appearing appendix. Scattered colonic diverticulosis
along the left colon without acute diverticulitis.

Vascular/Lymphatic: No pathologically enlarged lymph nodes. No
significant vascular abnormality seen.

Reproductive: Normal size prostate and seminal vesicles. Tiny
hypodensities within the seminal vesicles may represent small
seminal vesicle cysts.

Other: No free air nor free fluid. Both testicles are visualized
within the scrotal sac. The scrotum is thickened and edematous in
appearance and may represent scrotal cellulitis. No soft tissue
emphysema to suggest Fournier gangrene. Trace fluid about the left
testicle with subtle 17 x 7 mm hypodensity associated with the
anterior aspect of the left testicle of uncertain etiology. A tunica
albuginea cyst is a possibility although intratesticular abscess,
testicular cyst, hypodense mass or perfusion anomaly are not
entirely excluded. Hypervascular slightly engorged vessels within
the left hemiscrotum are noted suggesting inflammatory change.
Consider testicular ultrasound better assessment.

Musculoskeletal: Nonacute
IMPRESSION: 1. Diffuse scrotal thickening and edema without soft tissue
emphysema. Findings likely represent a cellulitis either
postinfectious or inflammatory. Fournier gangrene is believed less
likely given lack of soft tissue emphysema.
2. Slight heterogeneous enhancement of the left testicular
parenchyma with a subtle 17 x 8 mm ovoid hypodensity associated with
the left testicle. Ultrasound correlation is suggested. Differential
possibilities as above.
3. Thick-walled urinary bladder more likely due to underdistention.
Cystitis is not excluded.

## 2019-04-23 DIAGNOSIS — J45909 Unspecified asthma, uncomplicated: Secondary | ICD-10-CM | POA: Diagnosis not present

## 2019-04-23 DIAGNOSIS — I10 Essential (primary) hypertension: Secondary | ICD-10-CM | POA: Diagnosis not present

## 2019-04-23 DIAGNOSIS — J449 Chronic obstructive pulmonary disease, unspecified: Secondary | ICD-10-CM | POA: Diagnosis not present

## 2019-04-23 DIAGNOSIS — B351 Tinea unguium: Secondary | ICD-10-CM | POA: Diagnosis not present

## 2019-06-09 DIAGNOSIS — Z7189 Other specified counseling: Secondary | ICD-10-CM | POA: Diagnosis not present

## 2019-06-09 DIAGNOSIS — Z20828 Contact with and (suspected) exposure to other viral communicable diseases: Secondary | ICD-10-CM | POA: Diagnosis not present

## 2019-09-01 DIAGNOSIS — Z7189 Other specified counseling: Secondary | ICD-10-CM | POA: Diagnosis not present

## 2019-09-01 DIAGNOSIS — Z20828 Contact with and (suspected) exposure to other viral communicable diseases: Secondary | ICD-10-CM | POA: Diagnosis not present

## 2019-12-01 ENCOUNTER — Ambulatory Visit: Payer: BLUE CROSS/BLUE SHIELD | Attending: Internal Medicine

## 2019-12-01 DIAGNOSIS — Z20822 Contact with and (suspected) exposure to covid-19: Secondary | ICD-10-CM | POA: Insufficient documentation

## 2019-12-02 LAB — NOVEL CORONAVIRUS, NAA: SARS-CoV-2, NAA: NOT DETECTED

## 2019-12-03 ENCOUNTER — Telehealth: Payer: Self-pay | Admitting: Family Medicine

## 2019-12-03 NOTE — Telephone Encounter (Signed)
Pt aware covid lab test negative, not detected °

## 2019-12-10 DIAGNOSIS — Z720 Tobacco use: Secondary | ICD-10-CM | POA: Diagnosis not present

## 2019-12-10 DIAGNOSIS — E119 Type 2 diabetes mellitus without complications: Secondary | ICD-10-CM | POA: Diagnosis not present

## 2019-12-10 DIAGNOSIS — I10 Essential (primary) hypertension: Secondary | ICD-10-CM | POA: Diagnosis not present

## 2019-12-10 DIAGNOSIS — E663 Overweight: Secondary | ICD-10-CM | POA: Diagnosis not present

## 2019-12-25 DIAGNOSIS — R109 Unspecified abdominal pain: Secondary | ICD-10-CM | POA: Diagnosis not present

## 2019-12-25 DIAGNOSIS — K5900 Constipation, unspecified: Secondary | ICD-10-CM | POA: Diagnosis not present

## 2019-12-25 DIAGNOSIS — H9202 Otalgia, left ear: Secondary | ICD-10-CM | POA: Diagnosis not present

## 2019-12-25 DIAGNOSIS — R1084 Generalized abdominal pain: Secondary | ICD-10-CM | POA: Diagnosis not present

## 2020-01-13 DIAGNOSIS — R5383 Other fatigue: Secondary | ICD-10-CM | POA: Diagnosis not present

## 2020-01-13 DIAGNOSIS — Z13220 Encounter for screening for lipoid disorders: Secondary | ICD-10-CM | POA: Diagnosis not present

## 2020-01-13 DIAGNOSIS — Z0000 Encounter for general adult medical examination without abnormal findings: Secondary | ICD-10-CM | POA: Diagnosis not present

## 2020-01-13 DIAGNOSIS — Z125 Encounter for screening for malignant neoplasm of prostate: Secondary | ICD-10-CM | POA: Diagnosis not present

## 2020-01-13 DIAGNOSIS — E559 Vitamin D deficiency, unspecified: Secondary | ICD-10-CM | POA: Diagnosis not present

## 2020-01-13 DIAGNOSIS — Z7251 High risk heterosexual behavior: Secondary | ICD-10-CM | POA: Diagnosis not present

## 2020-01-13 DIAGNOSIS — Z114 Encounter for screening for human immunodeficiency virus [HIV]: Secondary | ICD-10-CM | POA: Diagnosis not present

## 2020-01-13 DIAGNOSIS — R0602 Shortness of breath: Secondary | ICD-10-CM | POA: Diagnosis not present

## 2020-01-13 DIAGNOSIS — Z136 Encounter for screening for cardiovascular disorders: Secondary | ICD-10-CM | POA: Diagnosis not present

## 2020-01-13 DIAGNOSIS — F17210 Nicotine dependence, cigarettes, uncomplicated: Secondary | ICD-10-CM | POA: Diagnosis not present

## 2020-01-13 DIAGNOSIS — Z1159 Encounter for screening for other viral diseases: Secondary | ICD-10-CM | POA: Diagnosis not present

## 2020-01-13 DIAGNOSIS — Z131 Encounter for screening for diabetes mellitus: Secondary | ICD-10-CM | POA: Diagnosis not present

## 2020-01-22 DIAGNOSIS — E669 Obesity, unspecified: Secondary | ICD-10-CM | POA: Diagnosis not present

## 2020-01-22 DIAGNOSIS — F17210 Nicotine dependence, cigarettes, uncomplicated: Secondary | ICD-10-CM | POA: Diagnosis not present

## 2020-01-22 DIAGNOSIS — Z008 Encounter for other general examination: Secondary | ICD-10-CM | POA: Diagnosis not present

## 2020-01-22 DIAGNOSIS — R0602 Shortness of breath: Secondary | ICD-10-CM | POA: Diagnosis not present

## 2020-01-27 DIAGNOSIS — M79674 Pain in right toe(s): Secondary | ICD-10-CM | POA: Diagnosis not present

## 2020-01-27 DIAGNOSIS — M79675 Pain in left toe(s): Secondary | ICD-10-CM | POA: Diagnosis not present

## 2020-01-27 DIAGNOSIS — B351 Tinea unguium: Secondary | ICD-10-CM | POA: Diagnosis not present

## 2020-01-29 DIAGNOSIS — I10 Essential (primary) hypertension: Secondary | ICD-10-CM | POA: Diagnosis not present

## 2020-01-29 DIAGNOSIS — L309 Dermatitis, unspecified: Secondary | ICD-10-CM | POA: Diagnosis not present

## 2020-01-29 DIAGNOSIS — E559 Vitamin D deficiency, unspecified: Secondary | ICD-10-CM | POA: Diagnosis not present

## 2020-02-01 DIAGNOSIS — M545 Low back pain: Secondary | ICD-10-CM | POA: Diagnosis not present

## 2020-02-17 ENCOUNTER — Emergency Department (HOSPITAL_COMMUNITY)
Admission: EM | Admit: 2020-02-17 | Discharge: 2020-02-18 | Disposition: A | Discharge status: Home / Self Care | Payer: Self-pay | Attending: Emergency Medicine | Admitting: Emergency Medicine

## 2020-02-17 ENCOUNTER — Other Ambulatory Visit: Payer: Self-pay

## 2020-02-17 ENCOUNTER — Encounter (HOSPITAL_COMMUNITY): Payer: Self-pay | Admitting: Emergency Medicine

## 2020-02-17 DIAGNOSIS — F329 Major depressive disorder, single episode, unspecified: Secondary | ICD-10-CM

## 2020-02-17 DIAGNOSIS — F17210 Nicotine dependence, cigarettes, uncomplicated: Secondary | ICD-10-CM | POA: Insufficient documentation

## 2020-02-17 DIAGNOSIS — Z20822 Contact with and (suspected) exposure to covid-19: Secondary | ICD-10-CM | POA: Insufficient documentation

## 2020-02-17 DIAGNOSIS — R45851 Suicidal ideations: Secondary | ICD-10-CM | POA: Insufficient documentation

## 2020-02-17 DIAGNOSIS — Z79899 Other long term (current) drug therapy: Secondary | ICD-10-CM | POA: Insufficient documentation

## 2020-02-17 DIAGNOSIS — I10 Essential (primary) hypertension: Secondary | ICD-10-CM | POA: Insufficient documentation

## 2020-02-17 DIAGNOSIS — F419 Anxiety disorder, unspecified: Secondary | ICD-10-CM | POA: Insufficient documentation

## 2020-02-17 DIAGNOSIS — F1210 Cannabis abuse, uncomplicated: Secondary | ICD-10-CM | POA: Insufficient documentation

## 2020-02-17 DIAGNOSIS — F32A Depression, unspecified: Secondary | ICD-10-CM

## 2020-02-17 LAB — COMPREHENSIVE METABOLIC PANEL
ALT: 20 U/L (ref 0–44)
AST: 17 U/L (ref 15–41)
Albumin: 4.4 g/dL (ref 3.5–5.0)
Alkaline Phosphatase: 97 U/L (ref 38–126)
Anion gap: 10 (ref 5–15)
BUN: 13 mg/dL (ref 6–20)
CO2: 25 mmol/L (ref 22–32)
Calcium: 9.2 mg/dL (ref 8.9–10.3)
Chloride: 109 mmol/L (ref 98–111)
Creatinine, Ser: 0.98 mg/dL (ref 0.61–1.24)
GFR calc Af Amer: >60 mL/min (ref >60)
GFR calc non Af Amer: >60 mL/min (ref >60)
Glucose, Bld: 96 mg/dL (ref 70–99)
Potassium: 3.6 mmol/L (ref 3.5–5.1)
Sodium: 144 mmol/L (ref 135–145)
Total Bilirubin: 1 mg/dL (ref 0.3–1.2)
Total Protein: 7.4 g/dL (ref 6.5–8.1)

## 2020-02-17 LAB — CBC
HCT: 41.4 % (ref 39.0–52.0)
Hemoglobin: 15 g/dL (ref 13.0–17.0)
MCH: 31.5 pg (ref 26.0–34.0)
MCHC: 36.2 g/dL — ABNORMAL HIGH (ref 30.0–36.0)
MCV: 87 fL (ref 80.0–100.0)
Platelets: 276 10*3/uL (ref 150–400)
RBC: 4.76 MIL/uL (ref 4.22–5.81)
RDW: 13.3 % (ref 11.5–15.5)
WBC: 10.9 10*3/uL — ABNORMAL HIGH (ref 4.0–10.5)
nRBC: 0 % (ref 0.0–0.2)

## 2020-02-17 LAB — RAPID URINE DRUG SCREEN, HOSP PERFORMED
Amphetamines: NOT DETECTED
Barbiturates: NOT DETECTED
Benzodiazepines: NOT DETECTED
Cocaine: NOT DETECTED
Opiates: NOT DETECTED
Tetrahydrocannabinol: POSITIVE — AB

## 2020-02-17 LAB — ETHANOL: Alcohol, Ethyl (B): <10 mg/dL (ref <10)

## 2020-02-17 LAB — SALICYLATE LEVEL: Salicylate Lvl: <7 mg/dL — ABNORMAL LOW (ref 7.0–30.0)

## 2020-02-17 LAB — RESPIRATORY PANEL BY RT PCR (FLU A&B, COVID)
Influenza A by PCR: NEGATIVE
Influenza B by PCR: NEGATIVE
SARS Coronavirus 2 by RT PCR: NEGATIVE

## 2020-02-17 LAB — ACETAMINOPHEN LEVEL: Acetaminophen (Tylenol), Serum: <10 ug/mL — ABNORMAL LOW (ref 10–30)

## 2020-02-17 MED ORDER — LISINOPRIL 20 MG PO TABS
20.0000 mg | ORAL_TABLET | Freq: Every day | ORAL | Status: DC
Start: 2020-02-17 — End: 2020-02-18
  Administered 2020-02-17 – 2020-02-18 (×2): 20 mg via ORAL
  Filled 2020-02-17 (×2): qty 1

## 2020-02-17 MED ORDER — LISINOPRIL-HYDROCHLOROTHIAZIDE 20-25 MG PO TABS: 1.0000 | ORAL_TABLET | Freq: Every day | ORAL | Status: DC

## 2020-02-17 MED ORDER — NICOTINE 14 MG/24HR TD PT24
14.0000 mg | MEDICATED_PATCH | Freq: Once | TRANSDERMAL | Status: DC
  Administered 2020-02-17: 14 mg via TRANSDERMAL
  Filled 2020-02-17: qty 1

## 2020-02-17 MED ORDER — HYDROCHLOROTHIAZIDE 25 MG PO TABS
25.0000 mg | ORAL_TABLET | Freq: Every day | ORAL | Status: DC
  Administered 2020-02-17 – 2020-02-18 (×2): 25 mg via ORAL
  Filled 2020-02-17 (×2): qty 1

## 2020-02-17 MED ORDER — ALBUTEROL SULFATE HFA 108 (90 BASE) MCG/ACT IN AERS
4.0000 | INHALATION_SPRAY | Freq: Once | RESPIRATORY_TRACT | Status: AC
  Administered 2020-02-17: 4 via RESPIRATORY_TRACT
  Filled 2020-02-17: qty 6.7

## 2020-02-17 NOTE — ED Provider Notes (Signed)
[facility] DEPT Provider Note   CSN: [id number] Arrival date & time: 02/17/20  1703     History Chief Complaint  Patient presents with  . Medical Clearance    Daniel Briggs is a 54 y.o. male with PMH significant for tobacco use disorder who presents to the ED via [facility] from his primary care provider's office for suicidal ideation with a specific plan.  I obtained history from [facility] reports that patient recently began Wellbutrin in an effort to cease his tobacco use.  He presented today for his F/U and informed his provider that he was teary-eyed and suicidal with a specific plan, which prompted them to call 911 for transfer to ED.  On my examination, patient reports that he began taking Wellbutrin approximately 4 weeks ago.  Since then, he noticed that he has become increasingly more irritable.  He lives at home with his son and his relationship has been strained recently.  He also recently got into a fight with his girlfriend due to his new onset irritability.  He is crying on my examination.  He reports that while he was at work today, he barked at Psychologist, forensic which his manager recognized as being very unlike him.  He denies any anhedonia, HI, AVH, history of psychiatric illness, recent illness, fevers or chills, abdominal pain, nausea or vomiting, drug ingestion, headache or dizziness, or other symptoms.  He does endorse mild wheezing which is consistent with his history of asthma.  He has continued to smoke cigarettes despite new medication.  HPI     Past Medical History:  Diagnosis Date  . Asthma   . Hypertension     Patient Active Problem List   Diagnosis Date Noted  . Epididymitis 07/25/2018    History reviewed. No pertinent surgical history.     Family History  Problem Relation Age of Onset  . Hypertension Mother   . Diabetes Mother   . Heart failure Mother   . Heart failure Father     Social History   Tobacco Use  . Smoking status:  Current Every Day Smoker    Packs/day: 1.00    Types: Cigarettes  . Smokeless tobacco: Never Used  Substance Use Topics  . Alcohol use: Yes    Alcohol/week: 3.0 standard drinks    Types: 3 Standard drinks or equivalent per week  . Drug use: Yes    Frequency: 1.0 times per week    Types: Marijuana    Home Medications Prior to Admission medications   Medication Sig Start Date End Date Taking? Authorizing Provider  acetaminophen (TYLENOL) 500 MG tablet Take 1,000 mg by mouth every 6 (six) hours as needed for moderate pain.    [provider]  cephALEXin (KEFLEX) 250 MG capsule Take 1 capsule (250 mg total) by mouth 4 (four) times daily. 07/23/18   Hayden Rasmussen, MD  hydrochlorothiazide (HYDRODIURIL) 12.5 MG tablet Take 1 tablet (12.5 mg total) by mouth daily. Patient not taking: Reported on 06/02/2018 10/20/15   Pixie Casino, MD  lisinopril-hydrochlorothiazide (PRINZIDE,ZESTORETIC) 20-25 MG tablet Take 1 tablet by mouth daily.    [provider]  naproxen sodium (ANAPROX) 550 MG tablet Take 550 mg by mouth 2 (two) times daily with a meal.    [provider]    Allergies    Penicillins  Review of Systems   Review of Systems  All other systems reviewed and are negative.   Physical Exam Updated Vital Signs BP (!) (P)  162/115 (BP Location: Left Arm)   Pulse (P) 79   Temp (P) 98.9 F (37.2 C) (Oral)   Resp (P) 18   Ht 5\' 8"  (1.727 m)   Wt 104.8 kg   SpO2 (P) 94%   BMI 35.12 kg/m   Physical Exam Vitals and nursing note reviewed. Exam conducted with a chaperone present.  Constitutional:      Comments: Tearful on exam.   HENT:     Head: Normocephalic and atraumatic.  Eyes:     General: No scleral icterus.    Extraocular Movements: Extraocular movements intact.     Conjunctiva/sclera: Conjunctivae normal.     Pupils: Pupils are equal, round, and reactive to light.  Cardiovascular:     Rate and Rhythm: Normal rate and regular rhythm.      Pulses: Normal pulses.     Heart sounds: Normal heart sounds.  Pulmonary:     Effort: Pulmonary effort is normal.     Breath sounds: Normal breath sounds.  Abdominal:     General: There is no distension.     Palpations: Abdomen is soft.     Tenderness: There is no abdominal tenderness. There is no guarding.  Musculoskeletal:        General: Normal range of motion.     Cervical back: Normal range of motion and neck supple. No rigidity.     Right lower leg: No edema.     Left lower leg: No edema.  Skin:    General: Skin is dry.     Capillary Refill: Capillary refill takes less than 2 seconds.  Neurological:     Mental Status: He is alert and oriented to person, place, and time.     GCS: GCS eye subscore is 4. GCS verbal subscore is 5. GCS motor subscore is 6.  Psychiatric:        Behavior: Behavior normal.        Thought Content: Thought content normal.     Comments: Teary-eyed. Reluctant to make eye contact.  Sad.      ED Results / Procedures / Treatments   Labs (all labs ordered are listed, but only abnormal results are displayed) Labs Reviewed  CBC - Abnormal; Notable for the following components:      Result Value   WBC 10.9 (*)    MCHC 36.2 (*)    All other components within normal limits  RAPID URINE DRUG SCREEN, HOSP PERFORMED - Abnormal; Notable for the following components:   Tetrahydrocannabinol POSITIVE (*)    All other components within normal limits  RESPIRATORY PANEL BY RT PCR (FLU A&B, COVID)  COMPREHENSIVE METABOLIC PANEL  ETHANOL  SALICYLATE LEVEL  ACETAMINOPHEN LEVEL    EKG None  Radiology No results found.  Procedures Procedures (including critical care time)  Medications Ordered in ED Medications  nicotine (NICODERM CQ - dosed in mg/24 hours) patch 14 mg (14 mg Transdermal Patch Applied 02/17/20 1833)  lisinopril-hydrochlorothiazide (ZESTORETIC) 20-25 MG per tablet 1 tablet (has no administration in time range)  albuterol (VENTOLIN HFA) 108  (90 Base) MCG/ACT inhaler 4 puff (4 puffs Inhalation Given 02/17/20 1834)    ED Course  I have reviewed the triage vital signs and the nursing notes.  Pertinent labs & imaging results that were available during my care of the patient were reviewed by me and considered in my medical decision making (see chart for details).    MDM Rules/Calculators/A&P  Patient denied any significant symptoms, but I noticed wheezing on exam.  Administered albuterol here in the ED.  On reevaluation, patient felt improved.  Patient's laboratory work-up was entirely reassuring and aside from an elevated blood pressure, his vital signs were within normal limits.  He is hemodynamically stable in no acute distress.  He is medically cleared.  No psychiatric history.  Given his progressively worsening suicidal ideation with specific plan in the context of new medication, would like TTS consultation and preferably admission for observation.  Reordered his HTN home medication.  Awaiting TTS to evaluate patient and determine disposition.  Patient to be admitted for observation.  Final Clinical Impression(s) / ED Diagnoses Final diagnoses:  Suicidal ideation  Depression, unspecified depression type    Rx / DC Orders ED Discharge Orders    None       Lorelee New, PA-C 02/17/20 2235    Milagros Loll, MD 02/17/20 2356

## 2020-02-17 NOTE — Progress Notes (Signed)
Renaye Rakers, NP recommends continued observation for safety and stabilization and to be reassessed in the AM by psych. EDP Chilton Si, Sharion Settler, PA-C and Rex Kras, RN have been advised.

## 2020-02-17 NOTE — ED Triage Notes (Signed)
Brought in by [facility].  They were called by pt's doctor's office Pt is suicidal with a plan.

## 2020-02-17 NOTE — BH Assessment (Signed)
Tele Assessment Note   Patient Name: Daniel Briggs MRN: [id number] Referring Physician: Elvera Maria Location of Patient: [facility] Location of Provider: Behavioral Health TTS Department  Daniel Briggs is an 54 y.o. male who presents to the ED voluntarily. Pt reports he was seen at the doctor's office earlier this date and expressed that he was feeling suicidal and homicidal. Pt states he believes it was due to a medication that he has been taking and states he no longer feels SI or HI. Pt states he does not recall the name of the medication. Pt states "I was just feeling crazy." Pt states he has never received psych tx in the past. Pt states he has not been sleeping for several days and reports he slept for about 4 hours total in the past 3-4 days. Pt is somewhat irritable during the assessment. TTS asked the pt if he has ever had a plan to commit suicide and pt states "I don't know how to answer that question." TTS then clarified and asked the pt if he has ever thought of ways to kill himself when he was feeling suicidal earlier this date and he denies. Per chart review, pt informed EDP that he had a specific plan but denies to this Clinical research associate. Pt declines to provide consent for TTS to speak with collaterals. Pt has reportedly been having several behavioral changes that are unlike him over the past several days.   Pt is alert and oriented during the assessment. Pt makes appropriate eye contact and responds to writer's questions. Pt does not appear to be responding to internal stimuli. Pt's mood is apprehensive and affect is flat and constricted. Pt's speech is logical and coherent. Pt's judgement is partial.   Adaku Anike, NP recommends continued observation for safety and stabilization and to be reassessed in the AM by psych. EDP Daniel Si, Sharion Settler, PA-C and Rex Kras, RN have been advised.  Diagnosis:  Unspecified anxiety d/o  Past Medical History:  Past Medical History:  Diagnosis  Date  . Asthma   . Hypertension     History reviewed. No pertinent surgical history.  Family History:  Family History  Problem Relation Age of Onset  . Hypertension Mother   . Diabetes Mother   . Heart failure Mother   . Heart failure Father     Social History:  reports that he has been smoking cigarettes. He has been smoking about 1.00 pack per day. He has never used smokeless tobacco. He reports current alcohol use of about 3.0 standard drinks of alcohol per week. He reports current drug use. Frequency: 1.00 time per week. Drug: Marijuana.  Additional Social History:  Alcohol / Drug Use Pain Medications: See MAR Prescriptions: See MAR Over the Counter: See MAR History of alcohol / drug use?: Yes Substance #1 Name of Substance 1: Cannabis 1 - Age of First Use: teens 1 - Amount (size/oz): varies 1 - Frequency: weekly 1 - Duration: ongoing 1 - Last Use / Amount: 1 week ago  CIWA: CIWA-Ar BP: (!) 146/81 Pulse Rate: 61 COWS:    Allergies:  Allergies  Allergen Reactions  . Penicillins Hives    Has patient had a PCN reaction causing immediate rash, facial/tongue/throat swelling, SOB or lightheadedness with hypotension: Yes Has patient had a PCN reaction causing severe rash involving mucus membranes or skin necrosis: No Has patient had a PCN reaction that required hospitalization: No Has patient had a PCN reaction occurring within the last 10 years: No If  all of the above answers are "NO", then may proceed with Cephalosporin use.     Home Medications: (Not in a hospital admission)   OB/GYN Status:  No LMP for male patient.  General Assessment Data Location of Assessment: [facility] ED TTS Assessment: In system Is this a Tele or Face-to-Face Assessment?: Tele Assessment Is this an Initial Assessment or a Re-assessment for this encounter?: Initial Assessment Patient Accompanied by:: N/A Language Other than English: No Living Arrangements: Other (Comment) What gender do  you identify as?: Male Marital status: Single Pregnancy Status: No Living Arrangements: Children Can pt return to current living arrangement?: Yes Admission Status: Voluntary Is patient capable of signing voluntary admission?: Yes Referral Source: Self/Family/Friend Insurance type: none     Crisis Care Plan Living Arrangements: Children Name of Psychiatrist: none Name of Therapist: none  Education Status Is patient currently in school?: No Is the patient employed, unemployed or receiving disability?: Employed  Risk to self with the past 6 months Suicidal Ideation: No-Not Currently/Within Last 6 Months Has patient been a risk to self within the past 6 months prior to admission? : No Suicidal Intent: No Has patient had any suicidal intent within the past 6 months prior to admission? : No Is patient at risk for suicide?: No Suicidal Plan?: No Has patient had any suicidal plan within the past 6 months prior to admission? : No Access to Means: No What has been your use of drugs/alcohol within the last 12 months?: cannabis Previous Attempts/Gestures: No Triggers for Past Attempts: None known Intentional Self Injurious Behavior: None Family Suicide History: No Recent stressful life event(s): Other (Comment)(medication changes) Persecutory voices/beliefs?: No Depression: No Substance abuse history and/or treatment for substance abuse?: No Suicide prevention information given to non-admitted patients: Not applicable  Risk to Others within the past 6 months Homicidal Ideation: No-Not Currently/Within Last 6 Months Does patient have any lifetime risk of violence toward others beyond the six months prior to admission? : No Thoughts of Harm to Others: No Current Homicidal Intent: No Current Homicidal Plan: No Access to Homicidal Means: No History of harm to others?: No Assessment of Violence: None Noted Does patient have access to weapons?: Yes (Comment)(guns) Criminal Charges  Pending?: No Does patient have a court date: No Is patient on probation?: No  Psychosis Hallucinations: None noted Delusions: None noted  Mental Status Report Appearance/Hygiene: Disheveled Eye Contact: Fair Motor Activity: Freedom of movement Speech: Logical/coherent Level of Consciousness: Alert, Irritable Mood: Apprehensive Affect: Flat, Constricted Anxiety Level: None Thought Processes: Relevant, Coherent Judgement: Partial Orientation: Place, Person, Time, Situation, Appropriate for developmental age Obsessive Compulsive Thoughts/Behaviors: None  Cognitive Functioning Concentration: Normal Memory: Remote Intact, Recent Intact Is patient IDD: No Insight: Fair Impulse Control: Good Appetite: Poor Have you had any weight changes? : Loss Amount of the weight change? (lbs): 10 lbs Sleep: Decreased Total Hours of Sleep: 3 Vegetative Symptoms: None  ADLScreening [facility] Assessment Services) Patient's cognitive ability adequate to safely complete daily activities?: Yes Patient able to express need for assistance with ADLs?: Yes Independently performs ADLs?: Yes (appropriate for developmental age)  Prior Inpatient Therapy Prior Inpatient Therapy: No  Prior Outpatient Therapy Prior Outpatient Therapy: No Does patient have an ACCT team?: No Does patient have Intensive In-House Services?  : No Does patient have [facility] services? : No Does patient have P4CC services?: No  ADL Screening (condition at time of admission) Patient's cognitive ability adequate to safely complete daily activities?: Yes Is the patient deaf or have difficulty hearing?:  No Does the patient have difficulty seeing, even when wearing glasses/contacts?: No Does the patient have difficulty concentrating, remembering, or making decisions?: No Patient able to express need for assistance with ADLs?: Yes Does the patient have difficulty dressing or bathing?: No Independently performs ADLs?: Yes  (appropriate for developmental age) Does the patient have difficulty walking or climbing stairs?: No Weakness of Legs: None Weakness of Arms/Hands: None  Home Assistive Devices/Equipment Home Assistive Devices/Equipment: None    Abuse/Neglect Assessment (Assessment to be complete while patient is alone) Abuse/Neglect Assessment Can Be Completed: Yes Physical Abuse: Denies Verbal Abuse: Denies Sexual Abuse: Denies Exploitation of patient/patient's resources: Denies Self-Neglect: Denies     Regulatory affairs officer (For Healthcare) Does Patient Have a Medical Advance Directive?: No Would patient like information on creating a medical advance directive?: No - Patient declined          Disposition: Adaku Anike, NP recommends continued observation for safety and stabilization and to be reassessed in the AM by psych. EDP Nyoka Cowden, Rowe Clack, PA-C and Roxy Manns, RN have been advised.  Disposition Initial Assessment Completed for this Encounter: Yes Disposition of Patient: (overnight OBS) Patient refused recommended treatment: No  This service was provided via telemedicine using a 2-way, interactive audio and video technology.  Names of all persons participating in this telemedicine service and their role in this encounter. Name: Daniel Briggs Role: Patient  Name: Lind Covert, LCSW Role: TTS  Name:  Talbot Grumbling, NP  Role: [facility] provider       Lyanne Co 02/17/2020 10:59 PM

## 2020-02-17 NOTE — Progress Notes (Signed)
Received Daniel Briggs awake in his room eating his dinner tray. The sitter is at the bedside. He spoke with TTS earlier in the night then slept throughout the night without incident.

## 2020-02-18 MED ORDER — NICOTINE 21 MG/24HR TD PT24
21.0000 mg | MEDICATED_PATCH | Freq: Every day | TRANSDERMAL | Status: DC
  Administered 2020-02-18: 21 mg via TRANSDERMAL
  Filled 2020-02-18: qty 1

## 2020-02-18 NOTE — Consult Note (Signed)
[facility] Psych ED Discharge  02/18/2020 11:44 AM Daniel Briggs  MRN:  [id number] Principal Problem: <principal problem not specified> Discharge Diagnoses: Active Problems:   * No active hospital problems. *   Subjective: Patient assessed by nurse practitioner along with Dr. Dwyane Dee.  Patient alert and oriented, answers appropriately. Patient reports "I am doing better I am calm down, I guess the pills I was taking for stopping smoking made me feel kind of crazy."  Patient reports recently prescribed Wellbutrin to assist with tobacco cessation.  Patient reports "feeling funny" while taking Wellbutrin.  Patient reports discontinued Wellbutrin approximately 4 days ago. Patient denies suicidal and homicidal ideations.  Patient denies history of suicide attempts, denies history of self-harm.  Patient denies auditory and visual hallucinations.  Patient denies symptoms of paranoia.  Patient does not appear to be responding to internal stimuli. Patient reports he lives in [place] with his 29 year old son.  Patient denies access to weapons.  Patient denies alcohol and substance use.  Patient gives verbal consent to speak with his sister, Lamona Curl phone number [phone]. Patient sister, Lamona Curl, denies concerns for patient safety.  Patient sister denies concerns for patient safety.  Patient sister denies any history of suicidal ideations by patient.  Patient sister denies patient has access to weapons per her knowledge.  Patient sister reports she will be transporting patient home once discharged.  Total Time spent with patient: 30 minutes  Past Psychiatric History: Denies  Past Medical History:  Past Medical History:  Diagnosis Date  . Asthma   . Hypertension    History reviewed. No pertinent surgical history. Family History:  Family History  Problem Relation Age of Onset  . Hypertension Mother   . Diabetes Mother   . Heart failure Mother   . Heart failure Father    Family  Psychiatric  History: Denies Social History:  Social History   Substance and Sexual Activity  Alcohol Use Yes  . Alcohol/week: 3.0 standard drinks  . Types: 3 Standard drinks or equivalent per week     Social History   Substance and Sexual Activity  Drug Use Yes  . Frequency: 1.0 times per week  . Types: Marijuana    Social History   Socioeconomic History  . Marital status: Single    Spouse name: Not on file  . Number of children: Not on file  . Years of education: Not on file  . Highest education level: Not on file  Occupational History  . Not on file  Tobacco Use  . Smoking status: Current Every Day Smoker    Packs/day: 1.00    Types: Cigarettes  . Smokeless tobacco: Never Used  Substance and Sexual Activity  . Alcohol use: Yes    Alcohol/week: 3.0 standard drinks    Types: 3 Standard drinks or equivalent per week  . Drug use: Yes    Frequency: 1.0 times per week    Types: Marijuana  . Sexual activity: Yes  Other Topics Concern  . Not on file  Social History Narrative  . Not on file   Social Determinants of Health   Financial Resource Strain:   . Difficulty of Paying Living Expenses:   Food Insecurity:   . Worried About Charity fundraiser in the Last Year:   . Arboriculturist in the Last Year:   Transportation Needs:   . Film/video editor (Medical):   Marland Kitchen Lack of Transportation (Non-Medical):   Physical Activity:   . Days of  Exercise per Week:   . Minutes of Exercise per Session:   Stress:   . Feeling of Stress :   Social Connections:   . Frequency of Communication with Friends and Family:   . Frequency of Social Gatherings with Friends and Family:   . Attends Religious Services:   . Active Member of Clubs or Organizations:   . Attends Banker Meetings:   Marland Kitchen Marital Status:     Has this patient used any form of tobacco in the last 30 days? (Cigarettes, Smokeless Tobacco, Cigars, and/or Pipes) A prescription for an FDA-approved  tobacco cessation medication was offered at discharge and the patient refused  Current Medications: Current Facility-Administered Medications  Medication Dose Route Frequency Provider Last Rate Last Admin  . lisinopril (ZESTRIL) tablet 20 mg  20 mg Oral Daily Cindi Carbon, RPH   20 mg at 02/18/20 1116   And  . hydrochlorothiazide (HYDRODIURIL) tablet 25 mg  25 mg Oral Daily Cindi Carbon, RPH   25 mg at 02/18/20 1115  . nicotine (NICODERM CQ - dosed in mg/24 hours) patch 21 mg  21 mg Transdermal Daily Money, Gerlene Burdock, FNP   21 mg at 02/18/20 9767   Current Outpatient Medications  Medication Sig Dispense Refill  . amLODipine (NORVASC) 5 MG tablet Take 5 mg by mouth daily.    Marland Kitchen BREO ELLIPTA 200-25 MCG/INH AEPB Inhale 1 puff into the lungs daily.    Marland Kitchen lisinopril-hydrochlorothiazide (PRINZIDE,ZESTORETIC) 20-25 MG tablet Take 1 tablet by mouth daily.    Marland Kitchen acetaminophen (TYLENOL) 500 MG tablet Take 1,000 mg by mouth every 6 (six) hours as needed for moderate pain.    Marland Kitchen albuterol (VENTOLIN HFA) 108 (90 Base) MCG/ACT inhaler Inhale 1-2 puffs into the lungs every 4 (four) hours as needed for shortness of breath or wheezing.    . cephALEXin (KEFLEX) 250 MG capsule Take 1 capsule (250 mg total) by mouth 4 (four) times daily. 40 capsule 0  . ciclopirox (PENLAC) 8 % solution Apply 1 application topically daily as needed for rash.    . diclofenac (VOLTAREN) 75 MG EC tablet Take 75 mg by mouth 2 (two) times daily.    . hydrochlorothiazide (HYDRODIURIL) 12.5 MG tablet Take 1 tablet (12.5 mg total) by mouth daily. (Patient not taking: Reported on 06/02/2018) 30 tablet 0  . methocarbamol (ROBAXIN) 500 MG tablet Take 500 mg by mouth 4 (four) times daily as needed for pain.    . metroNIDAZOLE (FLAGYL) 500 MG tablet Take 2,000 mg by mouth once.    . naproxen sodium (ANAPROX) 550 MG tablet Take 550 mg by mouth 2 (two) times daily with a meal.    . olmesartan-hydrochlorothiazide (BENICAR HCT) 40-25 MG tablet Take  1 tablet by mouth daily.    Marland Kitchen PROAIR RESPICLICK 108 (90 Base) MCG/ACT AEPB Inhale 1 puff into the lungs in the morning and at bedtime.    . triamcinolone cream (KENALOG) 0.1 % Apply 1 application topically in the morning, at noon, and at bedtime.    . Vitamin D, Ergocalciferol, (DRISDOL) 1.25 MG (50000 UNIT) CAPS capsule Take 50,000 Units by mouth once a week.     PTA Medications: (Not in a hospital admission)   Musculoskeletal: Strength & Muscle Tone: within normal limits Gait & Station: normal Patient leans: N/A  Psychiatric Specialty Exam: Physical Exam Vitals and nursing note reviewed.  Constitutional:      Appearance: He is well-developed.  HENT:     Head: Normocephalic.  Cardiovascular:     Rate and Rhythm: Normal rate.  Pulmonary:     Effort: Pulmonary effort is normal.  Musculoskeletal:        General: Normal range of motion.  Neurological:     Mental Status: He is alert and oriented to person, place, and time.  Psychiatric:        Mood and Affect: Mood normal.        Behavior: Behavior normal.        Thought Content: Thought content normal.        Judgment: Judgment normal.     Review of Systems  Blood pressure (!) 144/92, pulse (!) 57, temperature 98.3 F (36.8 C), temperature source Oral, resp. rate 18, height 5\' 8"  (1.727 m), weight 104.8 kg, SpO2 96 %.Body mass index is 35.12 kg/m.  General Appearance: Casual and Fairly Groomed  Eye Contact:  Good  Speech:  Clear and Coherent and Normal Rate  Volume:  Normal  Mood:  Euthymic  Affect:  Appropriate and Congruent  Thought Process:  Coherent, Goal Directed and Descriptions of Associations: Intact  Orientation:  Full (Time, Place, and Person)  Thought Content:  WDL and Logical  Suicidal Thoughts:  No  Homicidal Thoughts:  No  Memory:  Immediate;   Good Recent;   Good Remote;   Good  Judgement:  Good  Insight:  Good  Psychomotor Activity:  Normal  Concentration:  Concentration: Good and Attention Span:  Good  Recall:  Good  Fund of Knowledge:  Good  Language:  Good  Akathisia:  No  Handed:  Right  AIMS (if indicated):     Assets:  Communication Skills Desire for Improvement Financial Resources/Insurance Housing Intimacy Leisure Time Physical Health Resilience Social Support Talents/Skills Transportation  ADL's:  Intact  Cognition:  WNL  Sleep:        Demographic Factors:  Male  Loss Factors: NA  Historical Factors: NA  Risk Reduction Factors:   Living with another person, especially a relative, Positive social support, Positive therapeutic relationship and Positive coping skills or problem solving skills  Continued Clinical Symptoms:    Cognitive Features That Contribute To Risk:  None    Suicide Risk:  Minimal: No identifiable suicidal ideation.  Patients presenting with no risk factors but with morbid ruminations; may be classified as minimal risk based on the severity of the depressive symptoms    Plan Of Care/Follow-up recommendations:  Patient advised to abstain from use of Wellbutrin in future.  Wellbutrin added to patient's allergy profile. Other:  Follow up with established outpatient provider  Disposition: Discharge , FNP 02/18/2020, 11:44 AM

## 2020-02-18 NOTE — Discharge Instructions (Signed)
For your mental health needs, you are advised to follow up with [facility].  Call them at your earliest opportunity to schedule an intake appointment:       [facility]      [address]      [place], [place] [address]      [phone]      Crisis number: [phone]  

## 2020-02-18 NOTE — BH Assessment (Signed)
[facility] Assessment Progress Note  Per Berneice Heinrich, FNP, this pt does not require psychiatric hospitalization at this time.  Pt is to be discharged from [facility] with recommendation to follow up with [facility].  This has been included in pt's discharge instructions.  Pt's nurse, Kendal Hymen, has been notified.  Doylene Canning, MA Triage Specialist [phone]

## 2020-10-16 ENCOUNTER — Emergency Department (HOSPITAL_COMMUNITY)
Admission: EM | Admit: 2020-10-16 | Discharge: 2020-10-16 | Disposition: A | Discharge status: Home / Self Care | Payer: BLUE CROSS/BLUE SHIELD | Attending: Emergency Medicine | Admitting: Emergency Medicine

## 2020-10-16 ENCOUNTER — Emergency Department (HOSPITAL_COMMUNITY): Payer: BLUE CROSS/BLUE SHIELD

## 2020-10-16 ENCOUNTER — Encounter (HOSPITAL_COMMUNITY): Payer: Self-pay | Admitting: Emergency Medicine

## 2020-10-16 ENCOUNTER — Other Ambulatory Visit: Payer: Self-pay

## 2020-10-16 DIAGNOSIS — L02414 Cutaneous abscess of left upper limb: Secondary | ICD-10-CM | POA: Insufficient documentation

## 2020-10-16 DIAGNOSIS — I10 Essential (primary) hypertension: Secondary | ICD-10-CM | POA: Diagnosis not present

## 2020-10-16 DIAGNOSIS — J45909 Unspecified asthma, uncomplicated: Secondary | ICD-10-CM | POA: Insufficient documentation

## 2020-10-16 DIAGNOSIS — Z79899 Other long term (current) drug therapy: Secondary | ICD-10-CM | POA: Diagnosis not present

## 2020-10-16 DIAGNOSIS — Z23 Encounter for immunization: Secondary | ICD-10-CM | POA: Insufficient documentation

## 2020-10-16 DIAGNOSIS — L02519 Cutaneous abscess of unspecified hand: Secondary | ICD-10-CM

## 2020-10-16 LAB — BASIC METABOLIC PANEL
Anion gap: 9 (ref 5–15)
BUN: 16 mg/dL (ref 6–20)
CO2: 25 mmol/L (ref 22–32)
Calcium: 9.2 mg/dL (ref 8.9–10.3)
Chloride: 106 mmol/L (ref 98–111)
Creatinine, Ser: 0.97 mg/dL (ref 0.61–1.24)
GFR, Estimated: >60 mL/min (ref >60)
Glucose, Bld: 92 mg/dL (ref 70–99)
Potassium: 3.8 mmol/L (ref 3.5–5.1)
Sodium: 140 mmol/L (ref 135–145)

## 2020-10-16 LAB — CBC WITH DIFFERENTIAL/PLATELET
Abs Immature Granulocytes: 0.03 10*3/uL (ref 0.00–0.07)
Basophils Absolute: 0.1 10*3/uL (ref 0.0–0.1)
Basophils Relative: 0 %
Eosinophils Absolute: 0.1 10*3/uL (ref 0.0–0.5)
Eosinophils Relative: 1 %
HCT: 43.7 % (ref 39.0–52.0)
Hemoglobin: 15.7 g/dL (ref 13.0–17.0)
Immature Granulocytes: 0 %
Lymphocytes Relative: 27 %
Lymphs Abs: 3.5 10*3/uL (ref 0.7–4.0)
MCH: 31.8 pg (ref 26.0–34.0)
MCHC: 35.9 g/dL (ref 30.0–36.0)
MCV: 88.6 fL (ref 80.0–100.0)
Monocytes Absolute: 0.8 10*3/uL (ref 0.1–1.0)
Monocytes Relative: 6 %
Neutro Abs: 8.8 10*3/uL — ABNORMAL HIGH (ref 1.7–7.7)
Neutrophils Relative %: 66 %
Platelets: 256 10*3/uL (ref 150–400)
RBC: 4.93 MIL/uL (ref 4.22–5.81)
RDW: 12.9 % (ref 11.5–15.5)
WBC: 13.3 10*3/uL — ABNORMAL HIGH (ref 4.0–10.5)
nRBC: 0 % (ref 0.0–0.2)

## 2020-10-16 LAB — LACTIC ACID, PLASMA: Lactic Acid, Venous: 0.8 mmol/L (ref 0.5–1.9)

## 2020-10-16 MED ORDER — OXYCODONE-ACETAMINOPHEN 5-325 MG PO TABS: 1.0000 | ORAL_TABLET | Freq: Four times a day (QID) | ORAL | 0 refills | Status: DC | PRN

## 2020-10-16 MED ORDER — CLINDAMYCIN PHOSPHATE 600 MG/50ML IV SOLN
600.0000 mg | Freq: Once | INTRAVENOUS | Status: AC
  Administered 2020-10-16: 600 mg via INTRAVENOUS
  Filled 2020-10-16: qty 50

## 2020-10-16 MED ORDER — TETANUS-DIPHTH-ACELL PERTUSSIS 5-2.5-18.5 LF-MCG/0.5 IM SUSY
0.5000 mL | PREFILLED_SYRINGE | Freq: Once | INTRAMUSCULAR | Status: AC
  Administered 2020-10-16: 0.5 mL via INTRAMUSCULAR
  Filled 2020-10-16: qty 0.5

## 2020-10-16 MED ORDER — LIDOCAINE HCL (PF) 1 % IJ SOLN
10.0000 mL | Freq: Once | INTRAMUSCULAR | Status: AC
  Administered 2020-10-16: 10 mL
  Filled 2020-10-16: qty 10

## 2020-10-16 MED ORDER — CLINDAMYCIN HCL 150 MG PO CAPS: 450.0000 mg | ORAL_CAPSULE | Freq: Three times a day (TID) | ORAL | 0 refills | Status: AC

## 2020-10-16 NOTE — ED Provider Notes (Addendum)
[facility] EMERGENCY DEPARTMENT Provider Note   CSN: [id number] Arrival date & time: 10/16/20  2012     History Chief Complaint  Patient presents with  . Abscess    Left Hand    Daniel Briggs is a 54 y.o. male with PMHx asthma and HTN who presents to the ED today with complaint of pain,redness, and swelling to his left hand that he noticed 1 week ago.  Patient reports he woke up with a small bump to the palmar aspect of his left hand thenar eminence.  He states that it has progressively gotten larger in size with more swelling and more pain.  He went to an urgent care last week and was told that it was likely a wart and to follow-up with dermatology however he has not had a chance to do this.  He states that today while at work he noticed worsening pain and then noticed a red streaking up his arm concerned him.  He states the pain is now shooting up his left arm.  Patient has been taking over-the-counter pain medication without relief.  He denies any fevers, chills, drainage.  He denies any trauma to his hand including normal bite/scratch/IV drug use or any break in the skin prior to this happening.   The history is provided by the patient and medical records.       Past Medical History:  Diagnosis Date  . Asthma   . Hypertension     Patient Active Problem List   Diagnosis Date Noted  . Epididymitis 07/25/2018    History reviewed. No pertinent surgical history.     Family History  Problem Relation Age of Onset  . Hypertension Mother   . Diabetes Mother   . Heart failure Mother   . Heart failure Father     Social History   Tobacco Use  . Smoking status: Current Every Day Smoker    Packs/day: 1.00    Types: Cigarettes  . Smokeless tobacco: Never Used  Vaping Use  . Vaping Use: Never used  Substance Use Topics  . Alcohol use: Yes    Alcohol/week: 3.0 standard drinks    Types: 3 Standard drinks or equivalent per week  . Drug use: Yes     Frequency: 1.0 times per week    Types: Marijuana    Home Medications Prior to Admission medications   Medication Sig Start Date End Date Taking? Authorizing Provider  acetaminophen (TYLENOL) 500 MG tablet Take 1,000 mg by mouth every 6 (six) hours as needed for moderate pain.    [provider]  albuterol (VENTOLIN HFA) 108 (90 Base) MCG/ACT inhaler Inhale 1-2 puffs into the lungs every 4 (four) hours as needed for shortness of breath or wheezing. 02/10/20   [provider]  amLODipine (NORVASC) 5 MG tablet Take 5 mg by mouth daily. 01/29/20   [provider]  BREO ELLIPTA 200-25 MCG/INH AEPB Inhale 1 puff into the lungs daily. 01/22/20   [provider]  cephALEXin (KEFLEX) 250 MG capsule Take 1 capsule (250 mg total) by mouth 4 (four) times daily. 07/23/18   Terrilee Files, MD  ciclopirox [facility]) 8 % solution Apply 1 application topically daily as needed for rash. 01/27/20   [provider]  clindamycin (CLEOCIN) 150 MG capsule Take 3 capsules (450 mg total) by mouth 3 (three) times daily for 10 days. 10/16/20 10/26/20  Tanda Rockers, PA-C  diclofenac (VOLTAREN) 75 MG EC tablet Take 75 mg by  mouth 2 (two) times daily. 02/01/20   [provider]  hydrochlorothiazide (HYDRODIURIL) 12.5 MG tablet Take 1 tablet (12.5 mg total) by mouth daily. 10/20/15   Mabe, Latanya MaudlinMartha L, MD  lisinopril-hydrochlorothiazide (PRINZIDE,ZESTORETIC) 20-25 MG tablet Take 1 tablet by mouth daily.    [provider]  methocarbamol (ROBAXIN) 500 MG tablet Take 500 mg by mouth 4 (four) times daily as needed for pain. 02/01/20   [provider]  metroNIDAZOLE (FLAGYL) 500 MG tablet Take 2,000 mg by mouth once. 01/15/20   [provider]  naproxen sodium (ANAPROX) 550 MG tablet Take 550 mg by mouth 2 (two) times daily with a meal.    [provider]  olmesartan-hydrochlorothiazide (BENICAR HCT) 40-25 MG tablet Take 1 tablet by mouth daily.  12/10/19   [provider]  oxyCODONE-acetaminophen (PERCOCET/ROXICET) 5-325 MG tablet Take 1 tablet by mouth every 6 (six) hours as needed for severe pain. 10/16/20   Tanda RockersVenter, Meldon Hanzlik, PA-C  PROAIR RESPICLICK 108 (90 Base) MCG/ACT AEPB Inhale 1 puff into the lungs in the morning and at bedtime. 12/10/19   [provider]  triamcinolone cream (KENALOG) 0.1 % Apply 1 application topically in the morning, at noon, and at bedtime. 01/29/20   [provider]  Vitamin D, Ergocalciferol, (DRISDOL) 1.25 MG (50000 UNIT) CAPS capsule Take 50,000 Units by mouth once a week. 01/29/20   [provider]    Allergies    Wellbutrin [bupropion] and Penicillins  Review of Systems   Review of Systems  Constitutional: Negative for chills and fever.  Musculoskeletal: Positive for arthralgias.  Skin: Positive for color change (red streaking up left forearm).       + abscess to left hand  All other systems reviewed and are negative.   Physical Exam Updated Vital Signs BP (!) 168/102   Pulse 85   Temp 98.4 F (36.9 C) (Oral)   Resp 17   Ht 5\' 8"  (1.727 m)   Wt 105 kg   SpO2 95%   BMI 35.20 kg/m   Physical Exam Vitals and nursing note reviewed.  Constitutional:      Appearance: He is not ill-appearing.  HENT:     Head: Normocephalic and atraumatic.  Eyes:     Conjunctiva/sclera: Conjunctivae normal.  Cardiovascular:     Rate and Rhythm: Normal rate and regular rhythm.     Pulses: Normal pulses.  Pulmonary:     Effort: Pulmonary effort is normal.     Breath sounds: Normal breath sounds. No wheezing, rhonchi or rales.  Abdominal:     Palpations: Abdomen is soft.     Tenderness: There is no abdominal tenderness.  Musculoskeletal:     Cervical back: Neck supple.     Comments: See photos below. 1 x 1 cm area of fluctuance noted to the palmar aspect of the left hand with surrounding erythema and edema. Lymphangitis to left forearm that stops at the left [facility]. 2+ radial  pulse.   Skin:    General: Skin is warm and dry.  Neurological:     Mental Status: He is alert.           ED Results / Procedures / Treatments   Labs (all labs ordered are listed, but only abnormal results are displayed) Labs Reviewed  CBC WITH DIFFERENTIAL/PLATELET - Abnormal; Notable for the following components:      Result Value   WBC 13.3 (*)    Neutro Abs 8.8 (*)    All other components within  normal limits  CULTURE, BLOOD (ROUTINE X 2)  CULTURE, BLOOD (ROUTINE X 2)  BASIC METABOLIC PANEL  LACTIC ACID, PLASMA    EKG None  Radiology DG Hand Complete Left  Result Date: 10/16/2020 CLINICAL DATA:  Swollen left hand, painful, throbbing sensation. Patient states no known injury. EXAM: LEFT HAND - COMPLETE 3+ VIEW COMPARISON:  None. FINDINGS: There is no evidence of fracture or dislocation. Minimal degenerative change. No erosive lesion. Soft tissues are unremarkable. IMPRESSION: No acute osseous abnormality in the left hand. Electronically Signed   By: Emmaline Kluver M.D.   On: 10/16/2020 20:54    Procedures .Marland KitchenIncision and Drainage  Date/Time: 10/16/2020 11:39 PM Performed by: Tanda Rockers, PA-C Authorized by: Tanda Rockers, PA-C   Consent:    Consent obtained:  Verbal   Consent given by:  Patient   Risks discussed:  Bleeding, incomplete drainage, pain, infection and damage to other organs Location:    Type:  Abscess   Location:  Upper extremity   Upper extremity location:  Hand   Hand location:  L hand Pre-procedure details:    Skin preparation:  Povidone-iodine Anesthesia:    Anesthesia method:  Local infiltration   Local anesthetic:  Lidocaine 1% w/o epi Procedure type:    Complexity:  Complex Procedure details:    Incision types:  Single straight   Incision depth:  Dermal   Drainage:  Bloody and purulent   Drainage amount:  Scant   Packing materials:  1/4 in gauze Post-procedure details:    Procedure completion:  Tolerated well, no  immediate complications   (including critical care time)  Medications Ordered in ED Medications  lidocaine (PF) (XYLOCAINE) 1 % injection 10 mL (10 mLs Infiltration Given 10/16/20 2210)  Tdap (BOOSTRIX) injection 0.5 mL (0.5 mLs Intramuscular Given 10/16/20 2209)  clindamycin (CLEOCIN) IVPB 600 mg (0 mg Intravenous Stopped 10/16/20 2240)    ED Course  I have reviewed the triage vital signs and the nursing notes.  Pertinent labs & imaging results that were available during my care of the patient were reviewed by me and considered in my medical decision making (see chart for details).  Clinical Course as of 10/16/20 2314  Sun Oct 16, 2020  2228 WBC(!): 13.3 [MV]    Clinical Course User Index [MV] Tanda Rockers, [place]   MDM Rules/Calculators/A&P                          54 year old male presents to the ED today with complaint of swelling, redness, pain to his left hand that occurred about 1 week ago.  Began having worsening pain earlier today with red streaking up his arm causing concern.  Patient denies any trauma to the hand including any breakdown of his skin.  Denies any IV drug use.  On arrival to the ED vitals are stable.  Patient is afebrile, nontachycardic nontachypneic and appears to be in no acute distress.  An x-ray was obtained of patient's hand while he is in the waiting room which does not show any acute bony abnormality.  On exam patient has an obvious abscess to the thenar eminence of his left hand along the palmar aspect with a small area of fluctuance.  He does also have lymphangitis up his left forearm which is concerning today.  Attending physician Dr. Silverio Lay has evaluated patient as well and recommends performing I&D as well as obtaining labs at this time with blood cultures.  If any abnormalities in the  labs patient may require admission however will perform I&D at bedside.  Update tetanus.   CBC with leukocytosis 13.3 without left shift BMP without electrolyte  abnormalities Lactic acid within normal limits at 0.8  Pt tolerated procedure well without difficulty. Small amount of packing placed and dressing applied. Pt to be discharged home at this time with return in 48 hours for wound recheck. He is discharged with clindamycin with instructions to take daily as prescribed as well as warm compresses/massage of the abscess. I have also prescribed a short course of pain medication for patient. Pt is in agreement with plan and stable for discharge home.   This note was prepared using Dragon voice recognition software and may include unintentional dictation errors due to the inherent limitations of voice recognition software.  Final Clinical Impression(s) / ED Diagnoses Final diagnoses:  Hand abscess    Rx / DC Orders ED Discharge Orders         Ordered    clindamycin (CLEOCIN) 150 MG capsule  3 times daily        10/16/20 2311    oxyCODONE-acetaminophen (PERCOCET/ROXICET) 5-325 MG tablet  Every 6 hours PRN        10/16/20 2311           Discharge Instructions     Please pick up antibiotics and take as prescribed. It is very important to take the entire course of antibiotics.  While at home please apply warm compresses to your hand and massage the area to allow for natural drainage. I have also prescribed a short course of pain medication to take as needed. I would recommend taking Ibuprofen 600 mg (3 OTC tablets) and if you still have pain to take the narcotic pain medication at that time.   Return to the ED in 48 hours for wound recheck. They will remove the packing at that time and assess the wound to see if it is improving.          Tanda Rockers, PA-C 10/16/20 2339    Charlynne Pander, MD 10/22/20 2001

## 2020-10-16 NOTE — ED Notes (Signed)
Patient ambulatory back to H21. Gait steady. NAD. Awaiting lab results.

## 2020-10-16 NOTE — ED Triage Notes (Signed)
Patient reports abscess at left hand with swelling/ no drainage onset last week , denies injury . No fever or chills.

## 2020-10-16 NOTE — Discharge Instructions (Addendum)
Please pick up antibiotics and take as prescribed. It is very important to take the entire course of antibiotics.  While at home please apply warm compresses to your hand and massage the area to allow for natural drainage. I have also prescribed a short course of pain medication to take as needed. I would recommend taking Ibuprofen 600 mg (3 OTC tablets) and if you still have pain to take the narcotic pain medication at that time.   Return to the ED in 48 hours for wound recheck. They will remove the packing at that time and assess the wound to see if it is improving.

## 2020-10-16 NOTE — ED Notes (Signed)
ED Provider at bedside. 

## 2020-10-18 ENCOUNTER — Emergency Department (HOSPITAL_COMMUNITY)
Admission: EM | Admit: 2020-10-18 | Discharge: 2020-10-18 | Disposition: A | Discharge status: Home / Self Care | Payer: BC Managed Care – PPO | Attending: Emergency Medicine | Admitting: Emergency Medicine

## 2020-10-18 ENCOUNTER — Encounter (HOSPITAL_COMMUNITY): Payer: Self-pay | Admitting: Emergency Medicine

## 2020-10-18 DIAGNOSIS — Z5321 Procedure and treatment not carried out due to patient leaving prior to being seen by health care provider: Secondary | ICD-10-CM | POA: Diagnosis not present

## 2020-10-18 DIAGNOSIS — Z4800 Encounter for change or removal of nonsurgical wound dressing: Secondary | ICD-10-CM | POA: Diagnosis not present

## 2020-10-18 NOTE — ED Notes (Signed)
Pt spoke with clinical staff saying they had an appt with their pcp and will ask them to check the wound. Pt left at this time

## 2020-10-18 NOTE — ED Triage Notes (Signed)
Pt reports he is here for packing removal from L hand where abscess was I&D'ed.

## 2020-10-21 LAB — CULTURE, BLOOD (ROUTINE X 2)
Culture: NO GROWTH
Culture: NO GROWTH
Special Requests: ADEQUATE
Special Requests: ADEQUATE

## 2021-02-23 ENCOUNTER — Ambulatory Visit (HOSPITAL_COMMUNITY)
Admission: EM | Admit: 2021-02-23 | Discharge: 2021-02-23 | Disposition: A | Discharge status: Home / Self Care | Payer: BC Managed Care – PPO | Attending: Emergency Medicine | Admitting: Emergency Medicine

## 2021-02-23 ENCOUNTER — Encounter (HOSPITAL_COMMUNITY): Payer: Self-pay

## 2021-02-23 ENCOUNTER — Other Ambulatory Visit: Payer: Self-pay

## 2021-02-23 DIAGNOSIS — R21 Rash and other nonspecific skin eruption: Secondary | ICD-10-CM

## 2021-02-23 MED ORDER — HYDROXYZINE HCL 25 MG PO TABS: 25.0000 mg | ORAL_TABLET | Freq: Four times a day (QID) | ORAL | 0 refills | Status: AC

## 2021-02-23 MED ORDER — PREDNISONE 10 MG PO TABS: 40.0000 mg | ORAL_TABLET | Freq: Every day | ORAL | 0 refills | Status: AC

## 2021-02-23 NOTE — Discharge Instructions (Signed)
Take the prednisone daily for the next 5 days.  You can also take the Atarax as needed for itching.    You can apply lotions and creams for comfort.    Follow up if symptoms worsen or do not improve in the next few days.

## 2021-02-23 NOTE — ED Triage Notes (Signed)
Patient presents to Urgent Care with complaints of a generalized rash x couple days. Pt states he is concerned it may be related to a tick bite x 2 weeks ago. Treating symptoms with benadryl.   Denies fever, changes in diet/meds or any of his household items.

## 2021-02-23 NOTE — ED Provider Notes (Signed)
[facility]    CSN: [id number] Arrival date & time: 02/23/21  1936      History   Chief Complaint Chief Complaint  Patient presents with  . Rash    HPI Daniel Briggs is a 55 y.o. male.   Patient here for evaluation of generalized rash that started approximately 2 days ago.  Reports concerned due to a possible tick bite that occurred several weeks ago.  There is no obvious bull's-eye rash or concern for tick-borne illness.  Denies any fevers or chills.  Denies any changes to medications or detergents.  Reports taking bendaryl today for itching.  Denies any fevers, chest pain, shortness of breath, N/V/D, numbness, tingling, weakness, abdominal pain, or headaches.   ROS: As per HPI, all other pertinent ROS negative    The history is provided by the patient.  Rash   Past Medical History:  Diagnosis Date  . Asthma   . Hypertension     Patient Active Problem List   Diagnosis Date Noted  . Epididymitis 07/25/2018    History reviewed. No pertinent surgical history.     Home Medications    Prior to Admission medications   Medication Sig Start Date End Date Taking? Authorizing Provider  hydrOXYzine (ATARAX/VISTARIL) 25 MG tablet Take 1 tablet (25 mg total) by mouth every 6 (six) hours. 02/23/21  Yes Ivette Loyal, NP  predniSONE (DELTASONE) 10 MG tablet Take 4 tablets (40 mg total) by mouth daily for 5 days. 02/23/21 02/28/21 Yes Ivette Loyal, NP  acetaminophen (TYLENOL) 500 MG tablet Take 1,000 mg by mouth every 6 (six) hours as needed for moderate pain.    [provider]  albuterol (VENTOLIN HFA) 108 (90 Base) MCG/ACT inhaler Inhale 1-2 puffs into the lungs every 4 (four) hours as needed for shortness of breath or wheezing. 02/10/20   [provider]  amLODipine (NORVASC) 5 MG tablet Take 5 mg by mouth daily. 01/29/20   [provider]  BREO ELLIPTA 200-25 MCG/INH AEPB Inhale 1 puff into the lungs daily. 01/22/20   [provider]  cephALEXin (KEFLEX) 250 MG capsule Take 1 capsule (250 mg total) by mouth 4 (four) times daily. 07/23/18   Terrilee Files, MD  ciclopirox [facility]) 8 % solution Apply 1 application topically daily as needed for rash. 01/27/20   [provider]  diclofenac (VOLTAREN) 75 MG EC tablet Take 75 mg by mouth 2 (two) times daily. 02/01/20   [provider]  hydrochlorothiazide (HYDRODIURIL) 12.5 MG tablet Take 1 tablet (12.5 mg total) by mouth daily. 10/20/15   Mabe, Latanya Maudlin, MD  lisinopril-hydrochlorothiazide (PRINZIDE,ZESTORETIC) 20-25 MG tablet Take 1 tablet by mouth daily.    [provider]  methocarbamol (ROBAXIN) 500 MG tablet Take 500 mg by mouth 4 (four) times daily as needed for pain. 02/01/20   [provider]  metroNIDAZOLE (FLAGYL) 500 MG tablet Take 2,000 mg by mouth once. 01/15/20   [provider]  naproxen sodium (ANAPROX) 550 MG tablet Take 550 mg by mouth 2 (two) times daily with a meal.    [provider]  olmesartan-hydrochlorothiazide (BENICAR HCT) 40-25 MG tablet Take 1 tablet by mouth daily. 12/10/19   [provider]  oxyCODONE-acetaminophen (PERCOCET/ROXICET) 5-325 MG tablet Take 1 tablet by mouth every 6 (six) hours as needed for severe pain. 10/16/20   Tanda Rockers, PA-C  PROAIR RESPICLICK 108 (90 Base) MCG/ACT AEPB Inhale 1 puff into the lungs in the morning and at  bedtime. 12/10/19   [provider]  triamcinolone cream (KENALOG) 0.1 % Apply 1 application topically in the morning, at noon, and at bedtime. 01/29/20   [provider]  Vitamin D, Ergocalciferol, (DRISDOL) 1.25 MG (50000 UNIT) CAPS capsule Take 50,000 Units by mouth once a week. 01/29/20   [provider]    Family History Family History  Problem Relation Age of Onset  . Hypertension Mother   . Diabetes Mother   . Heart failure Mother   . Heart failure Father     Social History Social History   Tobacco  Use  . Smoking status: Current Every Day Smoker    Packs/day: 1.00    Types: Cigarettes  . Smokeless tobacco: Never Used  Vaping Use  . Vaping Use: Never used  Substance Use Topics  . Alcohol use: Not Currently    Alcohol/week: 3.0 standard drinks    Types: 3 Standard drinks or equivalent per week  . Drug use: Yes    Frequency: 1.0 times per week    Types: Marijuana     Allergies   Wellbutrin [bupropion] and Penicillins   Review of Systems Review of Systems  Skin: Positive for rash.  All other systems reviewed and are negative.    Physical Exam Triage Vital Signs ED Triage Vitals  Enc Vitals Group     BP 02/23/21 2009 (S) (!) 151/93     Pulse Rate 02/23/21 2009 66     Resp 02/23/21 2009 16     Temp 02/23/21 2009 98.1 F (36.7 C)     Temp Source 02/23/21 2009 Oral     SpO2 02/23/21 2009 96 %     Weight --      Height --      Head Circumference --      Peak Flow --      Pain Score 02/23/21 2036 0     Pain Loc --      Pain Edu? --      Excl. in GC? --    No data found.  Updated Vital Signs BP (S) (!) 151/93 (BP Location: Right Arm)   Pulse 66   Temp 98.1 F (36.7 C) (Oral)   Resp 16   SpO2 96%   Visual Acuity Right Eye Distance:   Left Eye Distance:   Bilateral Distance:    Right Eye Near:   Left Eye Near:    Bilateral Near:     Physical Exam Vitals and nursing note reviewed.  Constitutional:      General: He is not in acute distress.    Appearance: Normal appearance. He is not ill-appearing, toxic-appearing or diaphoretic.  HENT:     Head: Normocephalic and atraumatic.  Eyes:     Conjunctiva/sclera: Conjunctivae normal.  Cardiovascular:     Rate and Rhythm: Normal rate.     Pulses: Normal pulses.  Pulmonary:     Effort: Pulmonary effort is normal.  Abdominal:     General: Abdomen is flat.  Musculoskeletal:        General: Normal range of motion.     Cervical back: Normal range of motion.  Skin:    General: Skin is warm and dry.      Findings: Rash present. Rash is urticarial (diffuse across bilateral arms and legs).  Neurological:     General: No focal deficit present.     Mental Status: He is alert and oriented to person, place, and time.  Psychiatric:  Mood and Affect: Mood normal.      UC Treatments / Results  Labs (all labs ordered are listed, but only abnormal results are displayed) Labs Reviewed - No data to display  EKG   Radiology No results found.  Procedures Procedures (including critical care time)  Medications Ordered in UC Medications - No data to display  Initial Impression / Assessment and Plan / UC Course  I have reviewed the triage vital signs and the nursing notes.  Pertinent labs & imaging results that were available during my care of the patient were reviewed by me and considered in my medical decision making (see chart for details).     Rash, most likely contact dermatitis.  Unlikely related to possible tick bite.  Prednisone burst for the next 5 days.  Can take hydroxyzine as needed for itching.  May continue to use lotions or creams for comfort. Follow up for worsening symptoms.    Final Clinical Impressions(s) / UC Diagnoses   Final diagnoses:  Rash and nonspecific skin eruption     Discharge Instructions     Take the prednisone daily for the next 5 days.  You can also take the Atarax as needed for itching.    You can apply lotions and creams for comfort.    Follow up if symptoms worsen or do not improve in the next few days.     ED Prescriptions    Medication Sig Dispense Auth. Provider   predniSONE (DELTASONE) 10 MG tablet Take 4 tablets (40 mg total) by mouth daily for 5 days. 20 tablet Ivette Loyal, NP   hydrOXYzine (ATARAX/VISTARIL) 25 MG tablet Take 1 tablet (25 mg total) by mouth every 6 (six) hours. 12 tablet Ivette Loyal, NP     PDMP not reviewed this encounter.   Ivette Loyal, NP 02/23/21 2041

## 2021-07-04 IMAGING — CR DG HAND COMPLETE 3+V*L*
3 series · 3 of 3 positions shown · non-contrast
Comparison: None.

CLINICAL DATA: Swollen left hand, painful, throbbing sensation.
Patient states no known injury.

EXAM:
LEFT HAND - COMPLETE 3+ VIEW

[hand pa]
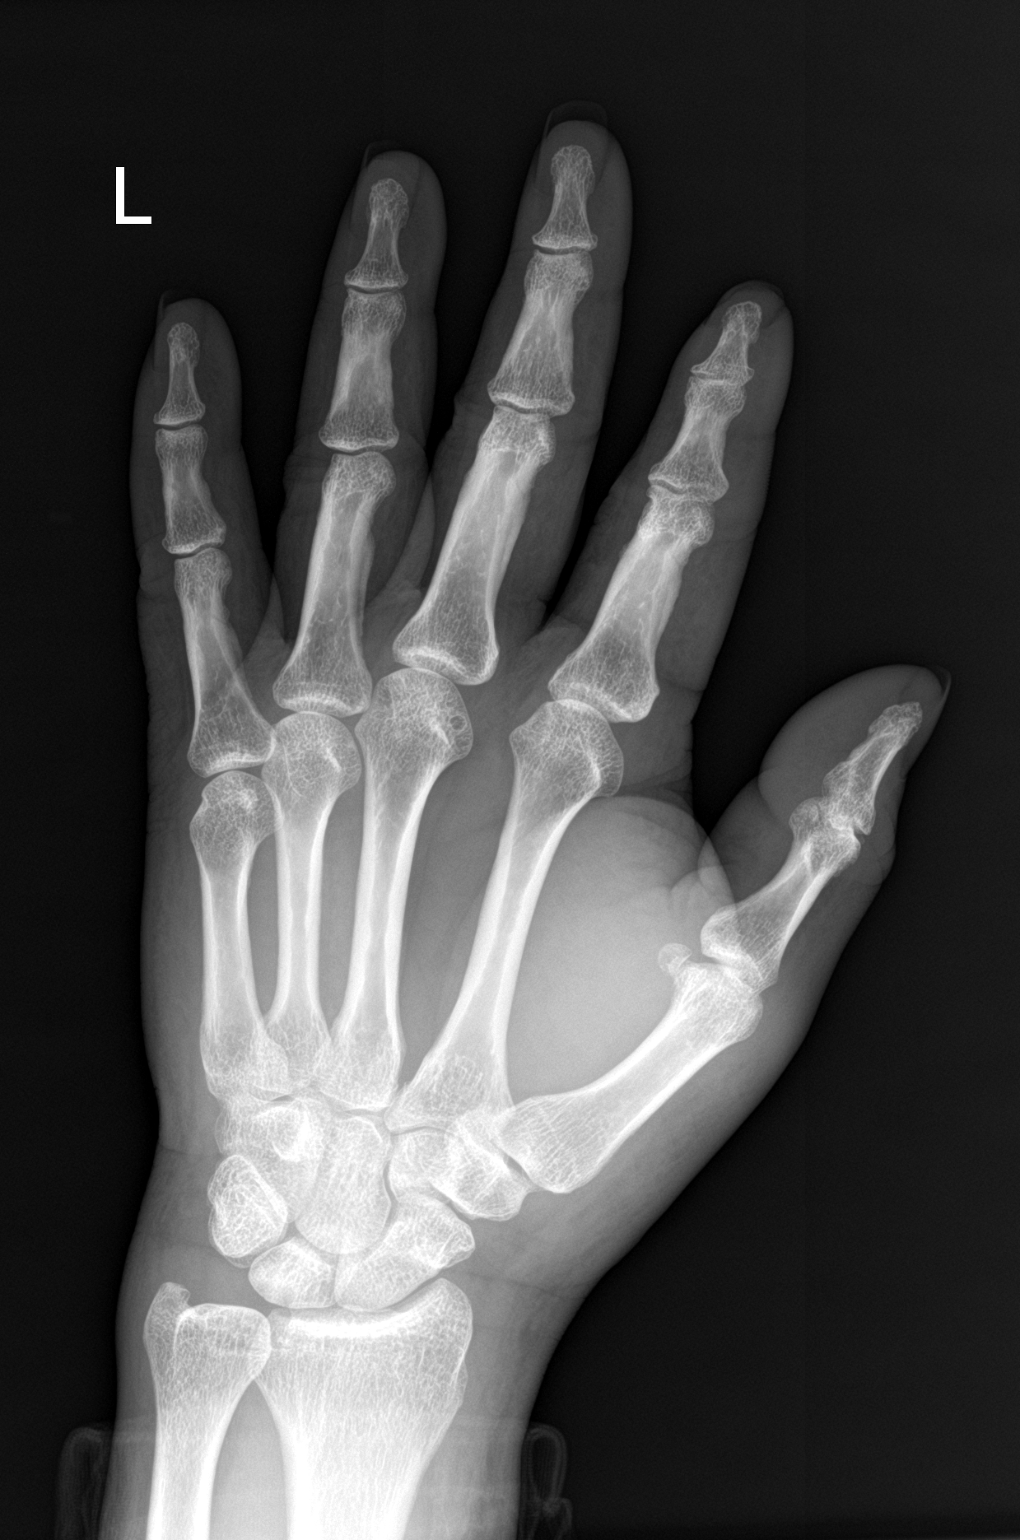

[hand obl]
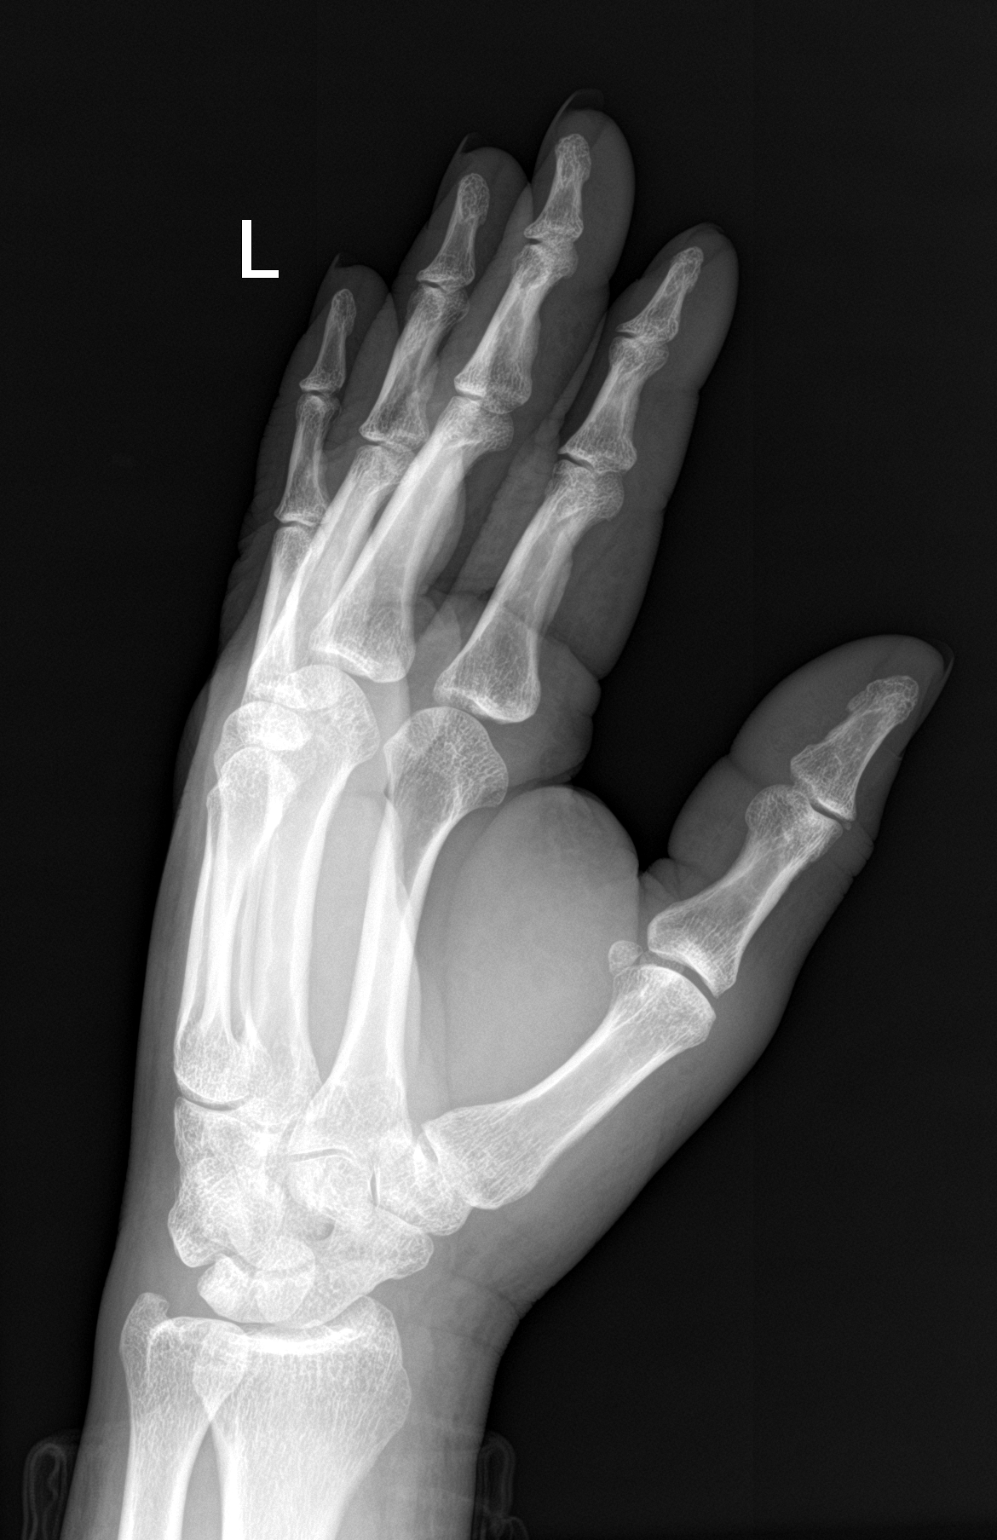

[hand lat]
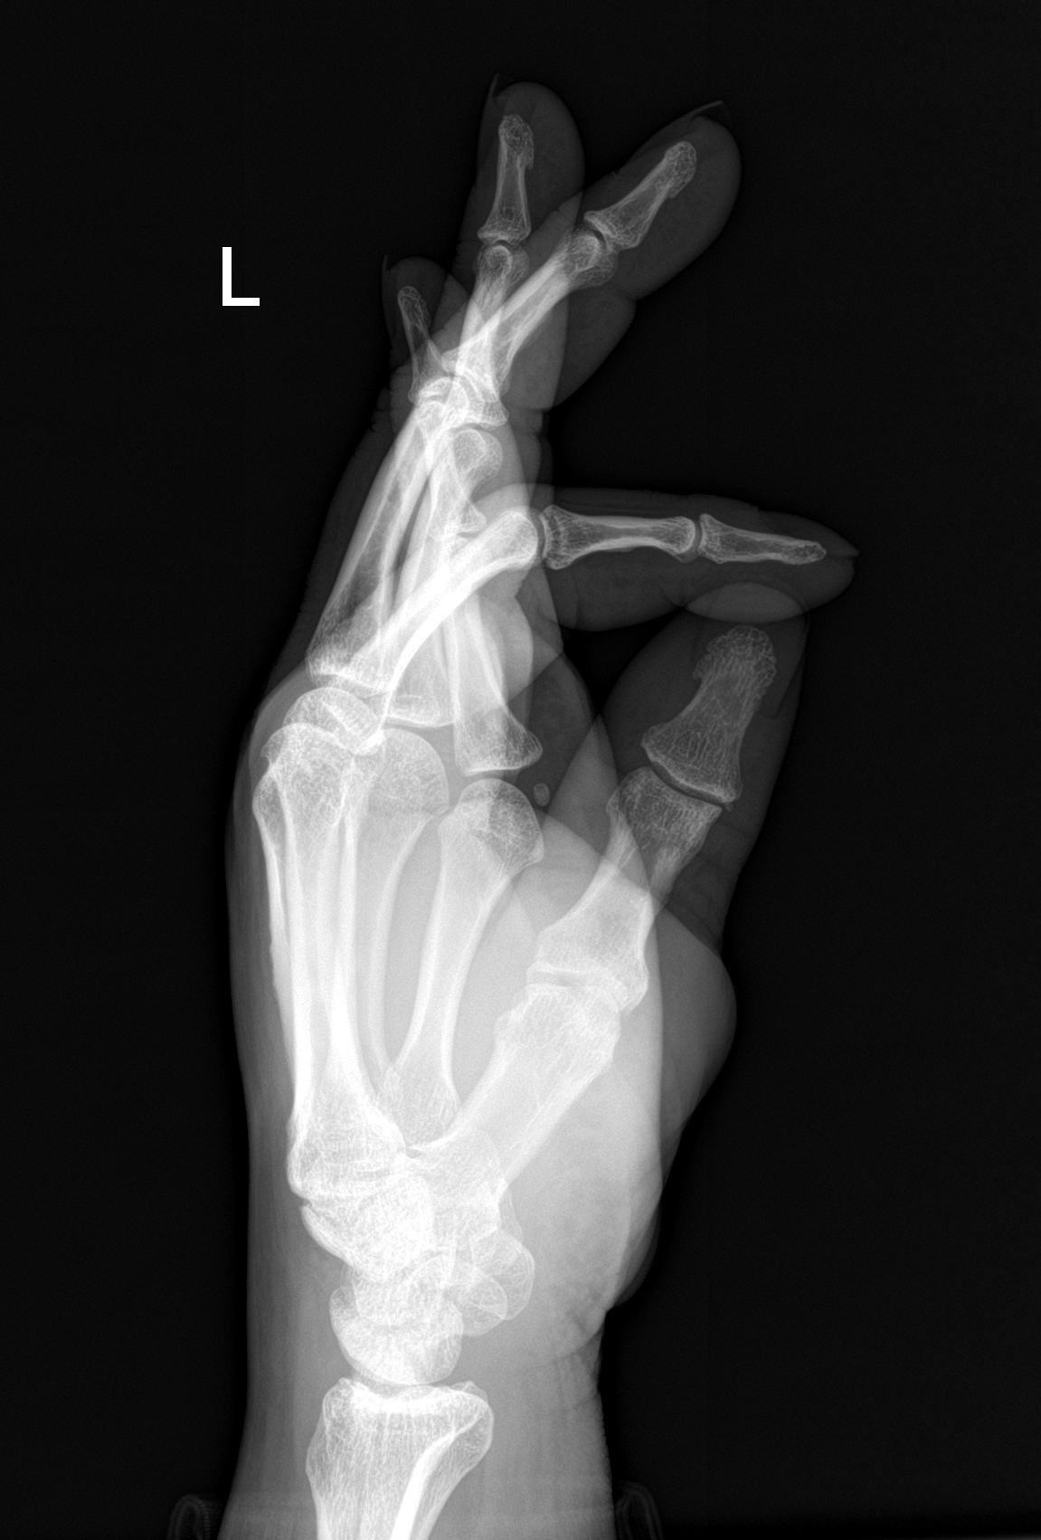

[3 of 3 positions shown; findings below may reference images not displayed]

FINDINGS: There is no evidence of fracture or dislocation. Minimal
degenerative change. No erosive lesion. Soft tissues are
unremarkable.
IMPRESSION: No acute osseous abnormality in the left hand.

## 2021-09-12 ENCOUNTER — Emergency Department (HOSPITAL_COMMUNITY)
Admission: EM | Admit: 2021-09-12 | Discharge: 2021-09-13 | Disposition: A | Discharge status: Home / Self Care | Payer: BC Managed Care – PPO | Attending: Emergency Medicine | Admitting: Emergency Medicine

## 2021-09-12 ENCOUNTER — Emergency Department (HOSPITAL_COMMUNITY): Payer: BC Managed Care – PPO

## 2021-09-12 ENCOUNTER — Encounter (HOSPITAL_COMMUNITY): Payer: Self-pay

## 2021-09-12 DIAGNOSIS — F17210 Nicotine dependence, cigarettes, uncomplicated: Secondary | ICD-10-CM | POA: Insufficient documentation

## 2021-09-12 DIAGNOSIS — Z79899 Other long term (current) drug therapy: Secondary | ICD-10-CM | POA: Insufficient documentation

## 2021-09-12 DIAGNOSIS — Z7951 Long term (current) use of inhaled steroids: Secondary | ICD-10-CM | POA: Diagnosis not present

## 2021-09-12 DIAGNOSIS — R109 Unspecified abdominal pain: Secondary | ICD-10-CM | POA: Diagnosis present

## 2021-09-12 DIAGNOSIS — N410 Acute prostatitis: Secondary | ICD-10-CM | POA: Diagnosis not present

## 2021-09-12 DIAGNOSIS — I10 Essential (primary) hypertension: Secondary | ICD-10-CM | POA: Diagnosis not present

## 2021-09-12 DIAGNOSIS — J45909 Unspecified asthma, uncomplicated: Secondary | ICD-10-CM | POA: Diagnosis not present

## 2021-09-12 DIAGNOSIS — D72829 Elevated white blood cell count, unspecified: Secondary | ICD-10-CM | POA: Diagnosis not present

## 2021-09-12 DIAGNOSIS — N419 Inflammatory disease of prostate, unspecified: Secondary | ICD-10-CM

## 2021-09-12 LAB — COMPREHENSIVE METABOLIC PANEL
ALT: 11 U/L (ref 0–44)
AST: 14 U/L — ABNORMAL LOW (ref 15–41)
Albumin: 4 g/dL (ref 3.5–5.0)
Alkaline Phosphatase: 92 U/L (ref 38–126)
Anion gap: 7 (ref 5–15)
BUN: 12 mg/dL (ref 6–20)
CO2: 26 mmol/L (ref 22–32)
Calcium: 9 mg/dL (ref 8.9–10.3)
Chloride: 106 mmol/L (ref 98–111)
Creatinine, Ser: 0.82 mg/dL (ref 0.61–1.24)
GFR, Estimated: >60 mL/min (ref >60)
Glucose, Bld: 162 mg/dL — ABNORMAL HIGH (ref 70–99)
Potassium: 2.9 mmol/L — ABNORMAL LOW (ref 3.5–5.1)
Sodium: 139 mmol/L (ref 135–145)
Total Bilirubin: 0.7 mg/dL (ref 0.3–1.2)
Total Protein: 7.4 g/dL (ref 6.5–8.1)

## 2021-09-12 LAB — URINALYSIS, ROUTINE W REFLEX MICROSCOPIC
Bacteria, UA: NONE SEEN
Bilirubin Urine: NEGATIVE
Glucose, UA: NEGATIVE mg/dL
Ketones, ur: 5 mg/dL — AB
Nitrite: NEGATIVE
Protein, ur: 30 mg/dL — AB
Specific Gravity, Urine: 1.026 (ref 1.005–1.030)
pH: 5 (ref 5.0–8.0)

## 2021-09-12 LAB — CBC WITH DIFFERENTIAL/PLATELET
Abs Immature Granulocytes: 0.03 10*3/uL (ref 0.00–0.07)
Basophils Absolute: 0.1 10*3/uL (ref 0.0–0.1)
Basophils Relative: 1 %
Eosinophils Absolute: 0 10*3/uL (ref 0.0–0.5)
Eosinophils Relative: 0 %
HCT: 39.8 % (ref 39.0–52.0)
Hemoglobin: 14.4 g/dL (ref 13.0–17.0)
Immature Granulocytes: 0 %
Lymphocytes Relative: 26 %
Lymphs Abs: 3.4 10*3/uL (ref 0.7–4.0)
MCH: 31.3 pg (ref 26.0–34.0)
MCHC: 36.2 g/dL — ABNORMAL HIGH (ref 30.0–36.0)
MCV: 86.5 fL (ref 80.0–100.0)
Monocytes Absolute: 0.7 10*3/uL (ref 0.1–1.0)
Monocytes Relative: 6 %
Neutro Abs: 8.7 10*3/uL — ABNORMAL HIGH (ref 1.7–7.7)
Neutrophils Relative %: 67 %
Platelets: 392 10*3/uL (ref 150–400)
RBC: 4.6 MIL/uL (ref 4.22–5.81)
RDW: 13.4 % (ref 11.5–15.5)
WBC: 13 10*3/uL — ABNORMAL HIGH (ref 4.0–10.5)
nRBC: 0 % (ref 0.0–0.2)

## 2021-09-12 LAB — LIPASE, BLOOD: Lipase: 21 U/L (ref 11–51)

## 2021-09-12 NOTE — ED Provider Notes (Addendum)
[facility]-EMERGENCY DEPT Provider Note   CSN: [id number] Arrival date & time: 09/12/21  1518     History Chief Complaint  Patient presents with   Urinary Frequency    Daniel Briggs is a 55 y.o. male with a hx of asthma, tobacco use, prior epididymitis, and HTN who presents to the ED with complaints of urinary sxs & abdominal pain x 1 week. Patient reports he is having dysuria, frequency, urgency, and hematuria with RLQ/suprapubic abdominal pain that somewhat radiates to the flank. Abdominal pain is constant, no alleviating/aggravating factors to this. He also has had some pain to the right buttocks radiating down the RLE with intermittent paresthesias, this is worse in certain positions and with certain movements, no alleviating factors. Denies recent injury. Denies fever, nausea, vomiting, testicular pain/swelling, weakness, incontinence, saddle anesthesia, IV drug use, or hx of cancer. Patient reports that he had STI testing 1 month ago that were all negative and he has not had any new sexual partners since- denies concern for STI.   HPI     Past Medical History:  Diagnosis Date   Asthma    Hypertension     Patient Active Problem List   Diagnosis Date Noted   Epididymitis 07/25/2018    History reviewed. No pertinent surgical history.     Family History  Problem Relation Age of Onset   Hypertension Mother    Diabetes Mother    Heart failure Mother    Heart failure Father     Social History   Tobacco Use   Smoking status: Every Day    Packs/day: 1.00    Types: Cigarettes   Smokeless tobacco: Never  Vaping Use   Vaping Use: Never used  Substance Use Topics   Alcohol use: Not Currently    Alcohol/week: 3.0 standard drinks    Types: 3 Standard drinks or equivalent per week   Drug use: Yes    Frequency: 1.0 times per week    Types: Marijuana    Home Medications Prior to Admission medications   Medication Sig Start Date End Date  Taking? Authorizing Provider  acetaminophen (TYLENOL) 500 MG tablet Take 1,000 mg by mouth every 6 (six) hours as needed for moderate pain.    [provider]  albuterol (VENTOLIN HFA) 108 (90 Base) MCG/ACT inhaler Inhale 1-2 puffs into the lungs every 4 (four) hours as needed for shortness of breath or wheezing. 02/10/20   [provider]  amLODipine (NORVASC) 5 MG tablet Take 5 mg by mouth daily. 01/29/20   [provider]  BREO ELLIPTA 200-25 MCG/INH AEPB Inhale 1 puff into the lungs daily. 01/22/20   [provider]  cephALEXin (KEFLEX) 250 MG capsule Take 1 capsule (250 mg total) by mouth 4 (four) times daily. 07/23/18   Terrilee Files, MD  ciclopirox [facility]) 8 % solution Apply 1 application topically daily as needed for rash. 01/27/20   [provider]  diclofenac (VOLTAREN) 75 MG EC tablet Take 75 mg by mouth 2 (two) times daily. 02/01/20   [provider]  hydrochlorothiazide (HYDRODIURIL) 12.5 MG tablet Take 1 tablet (12.5 mg total) by mouth daily. 10/20/15   Phillis Haggis, MD  hydrOXYzine (ATARAX/VISTARIL) 25 MG tablet Take 1 tablet (25 mg total) by mouth every 6 (six) hours. 02/23/21   Ivette Loyal, NP  lisinopril-hydrochlorothiazide (PRINZIDE,ZESTORETIC) 20-25 MG tablet Take 1 tablet by mouth daily.    [provider]  methocarbamol (ROBAXIN) 500 MG tablet Take  500 mg by mouth 4 (four) times daily as needed for pain. 02/01/20   [provider]  metroNIDAZOLE (FLAGYL) 500 MG tablet Take 2,000 mg by mouth once. 01/15/20   [provider]  naproxen sodium (ANAPROX) 550 MG tablet Take 550 mg by mouth 2 (two) times daily with a meal.    [provider]  olmesartan-hydrochlorothiazide (BENICAR HCT) 40-25 MG tablet Take 1 tablet by mouth daily. 12/10/19   [provider]  oxyCODONE-acetaminophen (PERCOCET/ROXICET) 5-325 MG tablet Take 1 tablet by mouth every 6 (six) hours as needed for severe pain.  10/16/20   Eustaquio Maize, PA-C  PROAIR RESPICLICK 123XX123 (90 Base) MCG/ACT AEPB Inhale 1 puff into the lungs in the morning and at bedtime. 12/10/19   [provider]  triamcinolone cream (KENALOG) 0.1 % Apply 1 application topically in the morning, at noon, and at bedtime. 01/29/20   [provider]  Vitamin D, Ergocalciferol, (DRISDOL) 1.25 MG (50000 UNIT) CAPS capsule Take 50,000 Units by mouth once a week. 01/29/20   [provider]    Allergies    Wellbutrin [bupropion] and Penicillins  Review of Systems   Review of Systems  Constitutional:  Negative for chills and fever.  Respiratory:  Negative for cough and shortness of breath.   Cardiovascular:  Negative for chest pain.  Gastrointestinal:  Positive for abdominal pain. Negative for blood in stool, diarrhea, nausea and vomiting.  Genitourinary:  Positive for dysuria, frequency and urgency. Negative for penile discharge, penile swelling, scrotal swelling and testicular pain.  Neurological:  Negative for syncope.  All other systems reviewed and are negative.  Physical Exam Updated Vital Signs BP (!) 177/101 (BP Location: Left Arm)   Pulse 71   Temp 99.8 F (37.7 C) (Oral)   Resp 18   Ht 5\' 8"  (1.727 m)   Wt 97.5 kg   SpO2 97%   BMI 32.69 kg/m   Physical Exam Vitals and nursing note reviewed.  Constitutional:      General: He is not in acute distress.    Appearance: He is well-developed. He is not toxic-appearing.  HENT:     Head: Normocephalic and atraumatic.  Eyes:     General:        Right eye: No discharge.        Left eye: No discharge.     Conjunctiva/sclera: Conjunctivae normal.  Cardiovascular:     Rate and Rhythm: Normal rate and regular rhythm.     Pulses:          Dorsalis pedis pulses are 2+ on the right side and 2+ on the left side.       Posterior tibial pulses are 2+ on the right side and 2+ on the left side.  Pulmonary:     Effort: Pulmonary effort is normal. No respiratory  distress.     Breath sounds: Normal breath sounds. No wheezing, rhonchi or rales.  Abdominal:     General: There is no distension.     Palpations: Abdomen is soft.     Tenderness: There is abdominal tenderness (RLQ and R suprapubic region).  Genitourinary:    Penis: Circumcised.      Testes:        Right: Mass or tenderness not present.        Left: Mass or tenderness not present.     Epididymis:     Right: No tenderness.     Left: No tenderness.     Comments: NT Jacalyn Lefevre  present as chaperone.  Musculoskeletal:     Cervical back: Normal range of motion and neck supple. No spinous process tenderness or muscular tenderness.     Comments: No obvious deformity, appreciable swelling, erythema, ecchymosis, significant open wounds, or increased warmth.  Lower extremities: Actively ranging throughout. No focal bony tenderness. No calf tenderness.  Back: No point/focal vertebral tenderness, no palpable step off or crepitus. Right lower lumbar paraspinal muscle tenderness and gluteal tenderness to palpation.   Lymphadenopathy:     Lower Body: No right inguinal adenopathy. No left inguinal adenopathy.  Skin:    General: Skin is warm and dry.     Capillary Refill: Capillary refill takes less than 2 seconds.     Findings: No rash.  Neurological:     Mental Status: He is alert.     Deep Tendon Reflexes:     Reflex Scores:      Patellar reflexes are 2+ on the right side and 2+ on the left side.    Comments: Sensation grossly intact to bilateral lower extremities. 5/5 symmetric strength with knee flexion/extension and ankle plantar/dorsiflexion bilaterally. Gait is intact without obvious foot drop.   Psychiatric:        Behavior: Behavior normal.    ED Results / Procedures / Treatments   Labs (all labs ordered are listed, but only abnormal results are displayed) Labs Reviewed  CBC WITH DIFFERENTIAL/PLATELET - Abnormal; Notable for the following components:      Result Value   WBC 13.0  (*)    MCHC 36.2 (*)    Neutro Abs 8.7 (*)    All other components within normal limits  COMPREHENSIVE METABOLIC PANEL - Abnormal; Notable for the following components:   Potassium 2.9 (*)    Glucose, Bld 162 (*)    AST 14 (*)    All other components within normal limits  URINALYSIS, ROUTINE W REFLEX MICROSCOPIC - Abnormal; Notable for the following components:   APPearance HAZY (*)    Hgb urine dipstick MODERATE (*)    Ketones, ur 5 (*)    Protein, ur 30 (*)    Leukocytes,Ua SMALL (*)    All other components within normal limits  LIPASE, BLOOD    EKG None  Radiology CT Renal Stone Study  Result Date: 09/12/2021 CLINICAL DATA:  Hematuria, right lower quadrant pain. EXAM: CT ABDOMEN AND PELVIS WITHOUT CONTRAST TECHNIQUE: Multidetector CT imaging of the abdomen and pelvis was performed following the standard protocol without IV contrast. COMPARISON:  05/15/2012. FINDINGS: Lower chest: There is a subpleural nodule in the right middle lobe measuring 3 mm, axial image 3. A 4 mm subpleural nodule is noted in the left lower lobe, axial image 18. The heart is normal in size. Hepatobiliary: No focal liver abnormality is seen. No gallstones, gallbladder wall thickening, or biliary dilatation. Pancreas: Unremarkable. No pancreatic ductal dilatation or surrounding inflammatory changes. Spleen: Normal in size without focal abnormality. Adrenals/Urinary Tract: The adrenal glands are within normal limits. No renal calculus is seen bilaterally. No ureteral calculus or obstructive uropathy bilaterally. Parapelvic cysts are noted on the left. Bladder wall thickening is noted posteriorly with surrounding fat stranding. Stomach/Bowel: No bowel obstruction, free air or pneumatosis. A normal appendix is present in the right lower quadrant. Scattered diverticular present along the colon without evidence of diverticulitis. Vascular/Lymphatic: Aortic atherosclerosis. Nonspecific prominent retroperitoneal lymph nodes  are noted along the aorta and iliac chains. Reproductive: The prostate gland is enlarged measuring 6 cm in diameter and surrounding fat stranding  is noted. There is an ill-defined low-attenuation region in the central prostate measuring 2.5 cm, possible prostatic abscess. Other: No free fluid. Musculoskeletal: Mild degenerative changes are present in the lower lumbar spine. No acute osseous abnormality is seen. IMPRESSION: 1. No renal or ureteral calculus or obstructive uropathy bilaterally. Stable left renal peripelvic cysts. 2. Mild bladder wall thickening with surrounding fat stranding bilaterally, possible infectious or inflammatory cystitis. 3. The prostate gland is enlarged with surrounding fat stranding. There is a central region of low attenuation measuring 2.5 cm, possible prostatic abscess. 4. Stable right middle lobe and left lower lobe pulmonary nodules. Electronically Signed   By: Thornell Sartorius M.D.   On: 09/12/2021 23:52    Procedures Procedures   Medications Ordered in ED Medications - No data to display  ED Course  I have reviewed the triage vital signs and the nursing notes.  Pertinent labs & imaging results that were available during my care of the patient were reviewed by me and considered in my medical decision making (see chart for details).    MDM Rules/Calculators/A&P                           Patient presents to the ED with complaints of right-sided lower abdominal/suprapubic pain with urinary symptoms for the past 1 week, also is having some pain in the right buttocks down the right leg intermittently.  Nontoxic and resting comfortably, blood pressure elevated, low suspicion for hypertensive emergency at this time.    Overall suspect the patient's presentation may involve 2 separate problems, will further evaluate his abdominal plain urinary complaints with a CT renal stone study  His right gluteal pain radiating down the leg seems suspicious for possible piriformis  syndrome with sciatica-he has no focal neurologic deficits and is ambulatory, do not suspect cord compression or acute cauda equina syndrome, additionally no hx of IVDU to suggest epidural abscess, no edema or calf tenderness to suggest DVT, no visible signs of infection on exam, symmetric pulses, NVI distally.   Additional history obtained:  Additional history obtained from chart review & nursing note review.   Lab Tests:  I reviewed and interpreted labs, which included:  CBC: mild leukocytosis @ 13,000 CMP: Hyperglycemia, renal function WNL Lipase: WNL UA: Hematuria, small leukocytes w/ WBCs noted, no bacteria.   Imaging Studies ordered:  I ordered imaging studies which included CT renal stone study, I independently reviewed, formal radiology impression shows: 1. No renal or ureteral calculus or obstructive uropathy bilaterally. Stable left renal peripelvic cysts. 2. Mild bladder wall thickening with surrounding fat stranding bilaterally, possible infectious or inflammatory cystitis. 3. The prostate gland is enlarged with surrounding fat stranding. There is a central region of low attenuation measuring 2.5 cm, possible prostatic abscess. 4. Stable right middle lobe and left lower lobe pulmonary nodules  ED Course:  CT findings of bladder and prostate gland fat stranding, per radiology possible prostatic abscess---> reevaluated the patient, he denies pain with bowel movements, digital rectal exam was performed with a chaperone-no tenderness to palpation.  He is overall well-appearing.  Will discuss with urology.  Initial page to urology on call for this facility occurred @ 00:41, re-paged x 5, attempted to call the office without success, suspect possible problem with pager/system, ultimately paged on call for [facility]- for assistance- discussed patient care, relayed that if patient overall well appearing, reasonable to discharge home on bactrim with outpatient follow up- appreciate input.   Patient  nontoxic, resting comfortably, his vitals are fairly unremarkable, did have an oral temperature 99.8, recheck 97.8, afebrile.  Mild leukocytosis noted.  Not tachycardic or tachypneic.  Normotensive/hypertensive at times.  Does not meet SIRS criteria.  Urine sent for culture.  GC/chlamydia testing from urine, however patient reports that he had negative testing 1 month ago and has not been sexually active since.  Bactrim initiated in the emergency department.  Will discharge home with prescription for this as well as analgesics.  Close urology follow-up, message sent to on call team for [place] to help facilitate this. I discussed results, treatment plan, need for follow-up, and return precautions with the patient. Provided opportunity for questions, patient confirmed understanding and is in agreement with plan.   Findings and plan of care discussed with supervising physician Dr. Wyvonnia Dusky who is in agreement.   Portions of this note were generated with [employer]. Dictation errors may occur despite best attempts at proofreading.  Final Clinical Impression(s) / ED Diagnoses Final diagnoses:  Prostatitis, unspecified prostatitis type    Rx / DC Orders ED Discharge Orders          Ordered    sulfamethoxazole-trimethoprim (BACTRIM DS) 800-160 MG tablet  2 times daily        09/13/21 0347    HYDROcodone-acetaminophen (NORCO/VICODIN) 5-325 MG tablet  Every 6 hours PRN        09/13/21 0347    potassium chloride SA (KLOR-CON) 20 MEQ tablet  Daily        09/13/21 0347    lidocaine (LIDODERM) 5 %  Daily PRN        09/13/21 0348               Amaryllis Dyke, PA-C 09/13/21 0528  05:45: CONSULT: Attending Dr. Wyvonnia Dusky received call from Dr. Louis Meckel- in agreement with plan documented above, appreciate input.     Amaryllis Dyke, PA-C 09/13/21 0551    Ezequiel Essex, MD 09/13/21 [phone]

## 2021-09-12 NOTE — ED Triage Notes (Signed)
Pt c/o urinary frequency, burning, and lower abdominal discomfort for a couple of weeks.

## 2021-09-12 NOTE — ED Notes (Signed)
Called lab to add on urine culture and gc/chlamydia to urine sent down previously.

## 2021-09-12 NOTE — ED Provider Notes (Signed)
Emergency Medicine Provider Triage Evaluation Note  ALICK LECOMTE , a 55 y.o. male  was evaluated in triage.  Pt complains of urinary symptoms for the past 3 weeks.  Describing as a pressure to the right lower quadrant of his abdomen radiating down his leg?Marland Kitchen  Does report he feels some pain along his right flank as well that is been ongoing for 3 weeks, has not taken any medication for improvement in symptoms.  Does endorse marijuana use.  No prior surgical history of abdominal intervention.  No fevers.  Review of Systems  Positive: urinary urgency, hematuria, flank pain Negative: Fever, nausea, vomiting  Physical Exam  There were no vitals taken for this visit. Gen:   Awake, no distress   Resp:  Normal effort  MSK:   Moves extremities without difficulty  Other:    Medical Decision Making  Medically screening exam initiated at 3:28 PM.  Appropriate orders placed.  Joella Prince was informed that the remainder of the evaluation will be completed by another provider, this initial triage assessment does not replace that evaluation, and the importance of remaining in the ED until their evaluation is complete.  Describes pain along the right flank, urinary frequency.  No MAT, stable vital signs on arrival.   Claude Manges, Cordelia Poche 09/12/21 1533    Arby Barrette, MD 09/12/21 [phone]

## 2021-09-13 MED ORDER — SULFAMETHOXAZOLE-TRIMETHOPRIM 800-160 MG PO TABS: 1.0000 | ORAL_TABLET | Freq: Two times a day (BID) | ORAL | 0 refills | Status: AC

## 2021-09-13 MED ORDER — HYDROCODONE-ACETAMINOPHEN 5-325 MG PO TABS: 1.0000 | ORAL_TABLET | Freq: Four times a day (QID) | ORAL | 0 refills | Status: AC | PRN

## 2021-09-13 MED ORDER — POTASSIUM CHLORIDE CRYS ER 20 MEQ PO TBCR
40.0000 meq | EXTENDED_RELEASE_TABLET | Freq: Once | ORAL | Status: AC
  Administered 2021-09-13: 40 meq via ORAL
  Filled 2021-09-13: qty 2

## 2021-09-13 MED ORDER — LIDOCAINE 5 % EX PTCH: 1.0000 | MEDICATED_PATCH | Freq: Every day | CUTANEOUS | 0 refills | Status: AC | PRN

## 2021-09-13 MED ORDER — SULFAMETHOXAZOLE-TRIMETHOPRIM 800-160 MG PO TABS
1.0000 | ORAL_TABLET | Freq: Once | ORAL | Status: AC
  Administered 2021-09-13: 1 via ORAL
  Filled 2021-09-13: qty 1

## 2021-09-13 MED ORDER — POTASSIUM CHLORIDE CRYS ER 20 MEQ PO TBCR: 20.0000 meq | EXTENDED_RELEASE_TABLET | Freq: Every day | ORAL | 0 refills | Status: AC

## 2021-09-13 NOTE — Discharge Instructions (Addendum)
You were seen in the emergency department today for abdominal pain.  Your CT scan showed findings of prostatitis.  We are sending you home with the following medicines: - Bactrim: This is an antibiotic to take twice per day, we have provided you with a 2-week prescription, it is essential that you follow-up with urology prior to completion of this prescription as it will need to be continued. -Potassium tablets: Take these for the next few days as your potassium levels were low. -Norco-this is a narcotic/controlled substance medication that has potential addicting qualities.  We recommend that you take 1-2 tablets every 6 hours as needed for severe pain.  Do not drive or operate heavy machinery when taking this medicine as it can be sedating. Do not drink alcohol or take other sedating medications when taking this medicine for safety reasons.  Keep this out of reach of small children.  Please be aware this medicine has Tylenol in it (325 mg/tab) do not exceed the maximum dose of Tylenol in a day per over the counter recommendations should you decide to supplement with Tylenol over the counter.   Your lower extremity pain seems somewhat suspicious for sciatica, we would like you to apply a Lidoderm patch to your area most significant pain in your buttocks once per day.  Remove and discard patch within 12 hours of application.  Please try applying heat to this area.  Follow-up with primary care for this.  Additionally your CT scan showed that you do have some pulmonary nodules that are similar to prior, please discuss this with your primary care provider.  We have prescribed you new medication(s) today. Discuss the medications prescribed today with your pharmacist as they can have adverse effects and interactions with your other medicines including over the counter and prescribed medications. Seek medical evaluation if you start to experience new or abnormal symptoms after taking one of these medicines, seek  care immediately if you start to experience difficulty breathing, feeling of your throat closing, facial swelling, or rash as these could be indications of a more serious allergic reaction  Follow-up with urology soon as possible.  Return to the emergency department for new or worsening symptoms including but not limited to new or worsening pain, fever, inability to keep fluids down, pain with bowel movements, or any other concerns.

## 2021-09-13 NOTE — ED Notes (Signed)
UROLOGY OFFICE CALLED TO [facility].

## 2021-09-13 NOTE — ED Notes (Signed)
UROLOGY OFFICE NUMBER RE-PAGED FOR CALL BACK FOR CONSULT [phone]

## 2021-09-13 NOTE — ED Notes (Signed)
Re-called  Consult for call back to [facility] PA from Calvin MD [phone]

## 2021-09-14 LAB — GC/CHLAMYDIA PROBE AMP (~~LOC~~) NOT AT ARMC
Chlamydia: NEGATIVE
Comment: NEGATIVE
Comment: NORMAL
Neisseria Gonorrhea: NEGATIVE

## 2021-09-14 LAB — URINE CULTURE: Culture: NO GROWTH

## 2021-09-15 ENCOUNTER — Other Ambulatory Visit: Payer: Self-pay

## 2021-09-15 ENCOUNTER — Encounter (HOSPITAL_COMMUNITY): Payer: Self-pay

## 2021-09-15 ENCOUNTER — Ambulatory Visit (HOSPITAL_COMMUNITY)
Admission: EM | Admit: 2021-09-15 | Discharge: 2021-09-15 | Disposition: A | Discharge status: Home / Self Care | Payer: BC Managed Care – PPO | Attending: Emergency Medicine | Admitting: Emergency Medicine

## 2021-09-15 DIAGNOSIS — J45901 Unspecified asthma with (acute) exacerbation: Secondary | ICD-10-CM

## 2021-09-15 DIAGNOSIS — M5431 Sciatica, right side: Secondary | ICD-10-CM

## 2021-09-15 MED ORDER — KETOROLAC TROMETHAMINE 60 MG/2ML IM SOLN
60.0000 mg | Freq: Once | INTRAMUSCULAR | Status: AC
  Administered 2021-09-15: 60 mg via INTRAMUSCULAR

## 2021-09-15 MED ORDER — KETOROLAC TROMETHAMINE 60 MG/2ML IM SOLN
INTRAMUSCULAR | Status: AC
  Filled 2021-09-15: qty 2

## 2021-09-15 MED ORDER — PREDNISONE 10 MG (21) PO TBPK: ORAL_TABLET | ORAL | 0 refills | Status: AC

## 2021-09-15 NOTE — ED Provider Notes (Signed)
[facility]    CSN: [id number] Arrival date & time: 09/15/21  0845      History   Chief Complaint Chief Complaint  Patient presents with   Back Pain    HPI Daniel Briggs is a 55 y.o. male.   Patient presents with concerns of right low back pain radiating into the right leg to the calf. He reports he has had this pain off and on for the past 2+ years. He was seen in the ER on 11/8 for prostatitis. He was prescribed Norco for his pain which he states has not been helping. The patient reports the pain is constant but worse with movements. He denies any known injury. The patient denies numbness or tingling into the leg.  He does report some wheezing recently. He has history of asthma but states he hasn't been using his Breo lately like he should.    Back Pain Associated symptoms: no abdominal pain, no fever, no numbness and no weakness    Past Medical History:  Diagnosis Date   Asthma    Hypertension     Patient Active Problem List   Diagnosis Date Noted   Epididymitis 07/25/2018    History reviewed. No pertinent surgical history.     Home Medications    Prior to Admission medications   Medication Sig Start Date End Date Taking? Authorizing Provider  predniSONE (STERAPRED UNI-PAK 21 TAB) 10 MG (21) TBPK tablet Take as directed 09/15/21  Yes Barbarajean Kinzler L, PA  acetaminophen (TYLENOL) 500 MG tablet Take 1,000 mg by mouth every 6 (six) hours as needed for moderate pain.    [provider]  albuterol (VENTOLIN HFA) 108 (90 Base) MCG/ACT inhaler Inhale 1-2 puffs into the lungs every 4 (four) hours as needed for shortness of breath or wheezing. 02/10/20   [provider]  amLODipine (NORVASC) 5 MG tablet Take 5 mg by mouth daily. 01/29/20   [provider]  BREO ELLIPTA 200-25 MCG/INH AEPB Inhale 1 puff into the lungs daily. 01/22/20   [provider]  ciclopirox (PENLAC) 8 % solution Apply 1 application topically daily as needed  for rash. 01/27/20   [provider]  diclofenac (VOLTAREN) 75 MG EC tablet Take 75 mg by mouth 2 (two) times daily. 02/01/20   [provider]  hydrochlorothiazide (HYDRODIURIL) 12.5 MG tablet Take 1 tablet (12.5 mg total) by mouth daily. 10/20/15   MabeLatanya Maudlin, MD  HYDROcodone-acetaminophen (NORCO/VICODIN) 5-325 MG tablet Take 1-2 tablets by mouth every 6 (six) hours as needed. 09/13/21   Petrucelli, Samantha R, PA-C  hydrOXYzine (ATARAX/VISTARIL) 25 MG tablet Take 1 tablet (25 mg total) by mouth every 6 (six) hours. 02/23/21   Ivette Loyal, NP  lidocaine (LIDODERM) 5 % Place 1 patch onto the skin daily as needed. Apply patch to area most significant pain once per day.  Remove and discard patch within 12 hours of application. 09/13/21   Petrucelli, Samantha R, PA-C  lisinopril-hydrochlorothiazide (PRINZIDE,ZESTORETIC) 20-25 MG tablet Take 1 tablet by mouth daily.    [provider]  methocarbamol (ROBAXIN) 500 MG tablet Take 500 mg by mouth 4 (four) times daily as needed for pain. 02/01/20   [provider]  naproxen sodium (ANAPROX) 550 MG tablet Take 550 mg by mouth 2 (two) times daily with a meal.    [provider]  olmesartan-hydrochlorothiazide (BENICAR HCT) 40-25 MG tablet Take 1 tablet by mouth daily. 12/10/19   [provider]  potassium chloride  SA (KLOR-CON) 20 MEQ tablet Take 1 tablet (20 mEq total) by mouth daily. 09/13/21   Petrucelli, Pleas Koch, PA-C  PROAIR RESPICLICK 108 (90 Base) MCG/ACT AEPB Inhale 1 puff into the lungs in the morning and at bedtime. 12/10/19   [provider]  sulfamethoxazole-trimethoprim (BACTRIM DS) 800-160 MG tablet Take 1 tablet by mouth 2 (two) times daily for 14 days. 09/13/21 09/27/21  Petrucelli, Samantha R, PA-C  triamcinolone cream (KENALOG) 0.1 % Apply 1 application topically in the morning, at noon, and at bedtime. 01/29/20   [provider]  Vitamin D, Ergocalciferol, (DRISDOL) 1.25 MG  (50000 UNIT) CAPS capsule Take 50,000 Units by mouth once a week. 01/29/20   [provider]    Family History Family History  Problem Relation Age of Onset   Hypertension Mother    Diabetes Mother    Heart failure Mother    Heart failure Father     Social History Social History   Tobacco Use   Smoking status: Every Day    Packs/day: 1.00    Types: Cigarettes   Smokeless tobacco: Never  Vaping Use   Vaping Use: Never used  Substance Use Topics   Alcohol use: Not Currently    Alcohol/week: 3.0 standard drinks    Types: 3 Standard drinks or equivalent per week   Drug use: Yes    Frequency: 1.0 times per week    Types: Marijuana     Allergies   Wellbutrin [bupropion] and Penicillins   Review of Systems Review of Systems  Constitutional:  Negative for fatigue and fever.  Respiratory:  Positive for wheezing. Negative for cough and shortness of breath.   Gastrointestinal:  Negative for abdominal pain.  Genitourinary:  Negative for difficulty urinating.  Musculoskeletal:  Positive for back pain.  Skin:  Negative for rash.  Neurological:  Negative for dizziness, weakness and numbness.    Physical Exam Triage Vital Signs ED Triage Vitals [09/15/21 1009]  Enc Vitals Group     BP (!) 143/96     Pulse Rate 80     Resp 18     Temp 98.4 F (36.9 C)     Temp Source Oral     SpO2 96 %     Weight      Height      Head Circumference      Peak Flow      Pain Score 8     Pain Loc      Pain Edu?      Excl. in [place]?    No data found.  Updated Vital Signs BP (!) 143/96 (BP Location: Left Arm)   Pulse 80   Temp 98.4 F (36.9 C) (Oral)   Resp 18   SpO2 96%   Visual Acuity Right Eye Distance:   Left Eye Distance:   Bilateral Distance:    Right Eye Near:   Left Eye Near:    Bilateral Near:     Physical Exam Vitals and nursing note reviewed.  Constitutional:      General: He is not in acute distress. HENT:     Head: Normocephalic.  Eyes:      Pupils: Pupils are equal, round, and reactive to light.  Cardiovascular:     Rate and Rhythm: Normal rate and regular rhythm.     Heart sounds: Normal heart sounds.  Pulmonary:     Effort: Pulmonary effort is normal.     Breath sounds: Wheezing present. No rhonchi or rales.  Musculoskeletal:     Lumbar back: Tenderness present. Decreased range of motion. Positive right straight leg raise test.     Comments: Right lumbar tenderness into buttock. Reports tenderness along posterior thigh and into mid-calf. Positive R SLR and reproduced R pain with L SLR. Decreased lumbar ROM due to pain.   Skin:    General: Skin is warm.     Findings: No rash.  Neurological:     Mental Status: He is alert.     Sensory: Sensation is intact.  Psychiatric:        Mood and Affect: Mood normal.     UC Treatments / Results  Labs (all labs ordered are listed, but only abnormal results are displayed) Labs Reviewed - No data to display  EKG   Radiology No results found.  Procedures Procedures (including critical care time)  Medications Ordered in UC Medications  ketorolac (TORADOL) injection 60 mg (has no administration in time range)    Initial Impression / Assessment and Plan / UC Course  I have reviewed the triage vital signs and the nursing notes.  Pertinent labs & imaging results that were available during my care of the patient were reviewed by me and considered in my medical decision making (see chart for details).     Sx consistent with sciatica - chronic with acute flare. Minimal improvement in pain with Norco from ER. Toradol IM and PO pred pack to help decrease inflammation and improve pain. Discussed following up with PCP or ortho/spine specialist given recurrent similar problems - suspect possible DDD as cause. Also acute asthma flare, will also be covered with pred pack - encouraged to restart using Breo.   E/M: 2 chronic illness with exacerbation, no data, moderate risk due to  prescription management  Final Clinical Impressions(s) / UC Diagnoses   Final diagnoses:  Sciatica of right side  Asthma with acute exacerbation, unspecified asthma severity, unspecified whether persistent     Discharge Instructions      Toradol injection given today to help with pain. Take prednisone as prescribed to help reduce inflammation and nerve irritation. Can continue taking the Norco prescribed by the ER. Recommend follow-up with PCP or spine specialist given chronic problems with back.      ED Prescriptions     Medication Sig Dispense Auth. Provider   predniSONE (STERAPRED UNI-PAK 21 TAB) 10 MG (21) TBPK tablet Take as directed 1 each Vallery Sa, Jameek Bruntz L, PA      PDMP not reviewed this encounter.   Estanislado Pandy, [place] 09/15/21 1047

## 2021-09-15 NOTE — ED Triage Notes (Signed)
Pt c/o lower back pain radiating down rt leg for a long time. States seen in ED on 11/8 and the meds not helping. Denies injury.

## 2021-09-15 NOTE — Discharge Instructions (Signed)
Toradol injection given today to help with pain. Take prednisone as prescribed to help reduce inflammation and nerve irritation. Can continue taking the Norco prescribed by the ER. Recommend follow-up with PCP or spine specialist given chronic problems with back.

## 2021-09-21 ENCOUNTER — Encounter (HOSPITAL_COMMUNITY): Payer: Self-pay

## 2021-09-21 ENCOUNTER — Other Ambulatory Visit: Payer: Self-pay

## 2021-09-21 ENCOUNTER — Ambulatory Visit (HOSPITAL_COMMUNITY)
Admission: EM | Admit: 2021-09-21 | Discharge: 2021-09-21 | Disposition: A | Discharge status: Home / Self Care | Payer: BC Managed Care – PPO | Attending: Internal Medicine | Admitting: Internal Medicine

## 2021-09-21 DIAGNOSIS — N410 Acute prostatitis: Secondary | ICD-10-CM | POA: Diagnosis not present

## 2021-09-21 DIAGNOSIS — M5431 Sciatica, right side: Secondary | ICD-10-CM

## 2021-09-21 LAB — POCT URINALYSIS DIPSTICK, ED / UC
Bilirubin Urine: NEGATIVE
Glucose, UA: NEGATIVE mg/dL
Ketones, ur: NEGATIVE mg/dL
Leukocytes,Ua: NEGATIVE
Nitrite: NEGATIVE
Protein, ur: 30 mg/dL — AB
Specific Gravity, Urine: >=1.03 (ref 1.005–1.030)
Urobilinogen, UA: 0.2 mg/dL (ref 0.0–1.0)
pH: 5.5 (ref 5.0–8.0)

## 2021-09-21 MED ORDER — MELOXICAM 7.5 MG PO TABS: 7.5000 mg | ORAL_TABLET | Freq: Every day | ORAL | 0 refills | Status: DC

## 2021-09-21 MED ORDER — TAMSULOSIN HCL 0.4 MG PO CAPS
0.4000 mg | ORAL_CAPSULE | Freq: Every day | ORAL | 2 refills | Status: AC
Start: 2021-09-21 — End: ?

## 2021-09-21 NOTE — Discharge Instructions (Addendum)
Increase oral fluid intake Take medications as prescribed Follow-up with urologist if lower abdominal pain persists Follow-up with sports medicine team if back pain persist.

## 2021-09-21 NOTE — ED Provider Notes (Signed)
[facility]    CSN: [id number] Arrival date & time: 09/21/21  1036      History   Chief Complaint No chief complaint on file.   HPI Daniel Briggs is a 55 y.o. male with a history of right-sided sciatica comes to urgent care with 1 day history of lower back pain radiating into the right leg.  Patient says symptoms started suddenly overnight.  He denies any fall.  Patient was recently prescribed tapering dose of prednisone for lower back pain radiating to the right leg.  Symptoms got worse after the patient took prednisone.  He had another flareup overnight.  No weakness in the lower extremity.  Patient was seen almost 2 weeks ago for prostatitis.  He is currently on Bactrim.  He complains of nocturia, hesitancy with micturition, straining at micturition and dribbling after voiding.  Is associated with pelvic pain.  No nausea or vomiting.  Patient has mild dysuria.   HPI  Past Medical History:  Diagnosis Date   Asthma    Hypertension     Patient Active Problem List   Diagnosis Date Noted   Epididymitis 07/25/2018    History reviewed. No pertinent surgical history.     Home Medications    Prior to Admission medications   Medication Sig Start Date End Date Taking? Authorizing Provider  meloxicam (MOBIC) 7.5 MG tablet Take 1 tablet (7.5 mg total) by mouth daily. 09/21/21  Yes Haven Pylant, Britta Mccreedy, MD  tamsulosin (FLOMAX) 0.4 MG CAPS capsule Take 1 capsule (0.4 mg total) by mouth daily after supper. 09/21/21  Yes Jamyria Ozanich, Britta Mccreedy, MD  acetaminophen (TYLENOL) 500 MG tablet Take 1,000 mg by mouth every 6 (six) hours as needed for moderate pain.    [provider]  albuterol (VENTOLIN HFA) 108 (90 Base) MCG/ACT inhaler Inhale 1-2 puffs into the lungs every 4 (four) hours as needed for shortness of breath or wheezing. 02/10/20   [provider]  amLODipine (NORVASC) 5 MG tablet Take 5 mg by mouth daily. 01/29/20   [provider]  BREO ELLIPTA  200-25 MCG/INH AEPB Inhale 1 puff into the lungs daily. 01/22/20   [provider]  ciclopirox (PENLAC) 8 % solution Apply 1 application topically daily as needed for rash. 01/27/20   [provider]  hydrochlorothiazide (HYDRODIURIL) 12.5 MG tablet Take 1 tablet (12.5 mg total) by mouth daily. 10/20/15   MabeLatanya Maudlin, MD  HYDROcodone-acetaminophen (NORCO/VICODIN) 5-325 MG tablet Take 1-2 tablets by mouth every 6 (six) hours as needed. 09/13/21   Petrucelli, Samantha R, PA-C  hydrOXYzine (ATARAX/VISTARIL) 25 MG tablet Take 1 tablet (25 mg total) by mouth every 6 (six) hours. 02/23/21   Ivette Loyal, NP  lidocaine (LIDODERM) 5 % Place 1 patch onto the skin daily as needed. Apply patch to area most significant pain once per day.  Remove and discard patch within 12 hours of application. 09/13/21   Petrucelli, Samantha R, PA-C  lisinopril-hydrochlorothiazide (PRINZIDE,ZESTORETIC) 20-25 MG tablet Take 1 tablet by mouth daily.    [provider]  methocarbamol (ROBAXIN) 500 MG tablet Take 500 mg by mouth 4 (four) times daily as needed for pain. 02/01/20   [provider]  olmesartan-hydrochlorothiazide (BENICAR HCT) 40-25 MG tablet Take 1 tablet by mouth daily. 12/10/19   [provider]  potassium chloride SA (KLOR-CON) 20 MEQ tablet Take 1 tablet (20 mEq total) by mouth daily. 09/13/21   Petrucelli, Samantha R, PA-C  predniSONE (STERAPRED UNI-PAK 21 TAB) 10  MG (21) TBPK tablet Take as directed 09/15/21   Abner Greenspan, Amy L, PA  PROAIR RESPICLICK [id number] (90 Base) MCG/ACT AEPB Inhale 1 puff into the lungs in the morning and at bedtime. 12/10/19   [provider]  sulfamethoxazole-trimethoprim (BACTRIM DS) 800-160 MG tablet Take 1 tablet by mouth 2 (two) times daily for 14 days. 09/13/21 09/27/21  Petrucelli, Samantha R, PA-C  triamcinolone cream (KENALOG) 0.1 % Apply 1 application topically in the morning, at noon, and at bedtime. 01/29/20   [provider]   Vitamin D, Ergocalciferol, (DRISDOL) 1.25 MG (50000 UNIT) CAPS capsule Take 50,000 Units by mouth once a week. 01/29/20   [provider]    Family History Family History  Problem Relation Age of Onset   Hypertension Mother    Diabetes Mother    Heart failure Mother    Heart failure Father     Social History Social History   Tobacco Use   Smoking status: Every Day    Packs/day: 1.00    Types: Cigarettes   Smokeless tobacco: Never  Vaping Use   Vaping Use: Never used  Substance Use Topics   Alcohol use: Not Currently    Alcohol/week: 3.0 standard drinks    Types: 3 Standard drinks or equivalent per week   Drug use: Yes    Frequency: 1.0 times per week    Types: Marijuana     Allergies   Wellbutrin [bupropion] and Penicillins   Review of Systems Review of Systems As per HPI  Physical Exam Triage Vital Signs ED Triage Vitals  Enc Vitals Group     BP 09/21/21 1142 (!) 149/105     Pulse Rate 09/21/21 1142 93     Resp 09/21/21 1142 18     Temp 09/21/21 1142 99.6 F (37.6 C)     Temp Source 09/21/21 1142 Oral     SpO2 09/21/21 1142 95 %     Weight --      Height --      Head Circumference --      Peak Flow --      Pain Score 09/21/21 1143 10     Pain Loc --      Pain Edu? --      Excl. in [place]? --    No data found.  Updated Vital Signs BP (!) 149/105 (BP Location: Left Arm)   Pulse 93   Temp 99.6 F (37.6 C) (Oral)   Resp 18   SpO2 95%   Visual Acuity Right Eye Distance:   Left Eye Distance:   Bilateral Distance:    Right Eye Near:   Left Eye Near:    Bilateral Near:     Physical Exam Constitutional:      General: He is not in acute distress.    Appearance: Normal appearance. He is not ill-appearing.  Cardiovascular:     Rate and Rhythm: Normal rate and regular rhythm.     Pulses: Normal pulses.     Heart sounds: Normal heart sounds.  Pulmonary:     Effort: Pulmonary effort is normal.     Breath sounds: Normal breath sounds.   Abdominal:     General: Bowel sounds are normal.     Palpations: Abdomen is soft.  Neurological:     Mental Status: He is alert.     UC Treatments / Results  Labs (all labs ordered are listed, but only abnormal results are displayed) Labs Reviewed  POCT URINALYSIS DIPSTICK, ED / UC -  Abnormal; Notable for the following components:      Result Value   Hgb urine dipstick MODERATE (*)    Protein, ur 30 (*)    All other components within normal limits    EKG   Radiology No results found.  Procedures Procedures (including critical care time)  Medications Ordered in UC Medications - No data to display  Initial Impression / Assessment and Plan / UC Course  I have reviewed the triage vital signs and the nursing notes.  Pertinent labs & imaging results that were available during my care of the patient were reviewed by me and considered in my medical decision making (see chart for details).     1.  Acute prostatitis with LUTO: Continue antibiotics Tamsulosin Point-of-care urinalysis is significant for hematuria Follow-up with urology in 1 to 2 weeks if symptoms are persistent.  2.  Right-sided sciatica: Meloxicam daily Gentle range of motion exercises If symptoms persist please follow-up with orthopedic surgery.   Final Clinical Impressions(s) / UC Diagnoses   Final diagnoses:  Acute prostatitis  Right sided sciatica     Discharge Instructions      Increase oral fluid intake Take medications as prescribed Follow-up with urologist if lower abdominal pain persists Follow-up with sports medicine team if back pain persist.   ED Prescriptions     Medication Sig Dispense Auth. Provider   tamsulosin (FLOMAX) 0.4 MG CAPS capsule Take 1 capsule (0.4 mg total) by mouth daily after supper. 30 capsule Lavonya Hoerner, Myrene Galas, MD   meloxicam (MOBIC) 7.5 MG tablet Take 1 tablet (7.5 mg total) by mouth daily. 30 tablet Meeghan Skipper, Myrene Galas, MD      PDMP not reviewed this  encounter.   Chase Picket, MD 09/21/21 [phone]

## 2021-09-21 NOTE — ED Triage Notes (Signed)
Pt c/o pain on urination with urgency and frequency since last night. C/o pain to groin area.

## 2021-09-30 ENCOUNTER — Encounter (HOSPITAL_COMMUNITY): Payer: Self-pay | Admitting: Emergency Medicine

## 2021-09-30 ENCOUNTER — Emergency Department (HOSPITAL_COMMUNITY)
Admission: EM | Admit: 2021-09-30 | Discharge: 2021-10-01 | Disposition: A | Discharge status: Home / Self Care | Payer: BC Managed Care – PPO | Attending: Emergency Medicine | Admitting: Emergency Medicine

## 2021-09-30 ENCOUNTER — Other Ambulatory Visit: Payer: Self-pay

## 2021-09-30 DIAGNOSIS — R300 Dysuria: Secondary | ICD-10-CM | POA: Diagnosis not present

## 2021-09-30 DIAGNOSIS — R102 Pelvic and perineal pain: Secondary | ICD-10-CM | POA: Insufficient documentation

## 2021-09-30 DIAGNOSIS — R110 Nausea: Secondary | ICD-10-CM | POA: Insufficient documentation

## 2021-09-30 DIAGNOSIS — Z5321 Procedure and treatment not carried out due to patient leaving prior to being seen by health care provider: Secondary | ICD-10-CM | POA: Diagnosis not present

## 2021-09-30 DIAGNOSIS — N3001 Acute cystitis with hematuria: Secondary | ICD-10-CM | POA: Diagnosis not present

## 2021-09-30 LAB — URINALYSIS, ROUTINE W REFLEX MICROSCOPIC
Bilirubin Urine: NEGATIVE
Glucose, UA: NEGATIVE mg/dL
Ketones, ur: NEGATIVE mg/dL
Nitrite: NEGATIVE
Protein, ur: 100 mg/dL — AB
Specific Gravity, Urine: 1.024 (ref 1.005–1.030)
WBC, UA: >50 WBC/hpf — ABNORMAL HIGH (ref 0–5)
pH: 5 (ref 5.0–8.0)

## 2021-09-30 LAB — BASIC METABOLIC PANEL WITH GFR
Anion gap: 7 (ref 5–15)
BUN: 11 mg/dL (ref 6–20)
CO2: 25 mmol/L (ref 22–32)
Calcium: 9 mg/dL (ref 8.9–10.3)
Chloride: 107 mmol/L (ref 98–111)
Creatinine, Ser: 1.04 mg/dL (ref 0.61–1.24)
GFR, Estimated: >60 mL/min
Glucose, Bld: 103 mg/dL — ABNORMAL HIGH (ref 70–99)
Potassium: 3.9 mmol/L (ref 3.5–5.1)
Sodium: 139 mmol/L (ref 135–145)

## 2021-09-30 LAB — CBC
HCT: 38.8 % — ABNORMAL LOW (ref 39.0–52.0)
Hemoglobin: 13.9 g/dL (ref 13.0–17.0)
MCH: 31.2 pg (ref 26.0–34.0)
MCHC: 35.8 g/dL (ref 30.0–36.0)
MCV: 87 fL (ref 80.0–100.0)
Platelets: 400 10*3/uL (ref 150–400)
RBC: 4.46 MIL/uL (ref 4.22–5.81)
RDW: 13.6 % (ref 11.5–15.5)
WBC: 16.7 10*3/uL — ABNORMAL HIGH (ref 4.0–10.5)
nRBC: 0 % (ref 0.0–0.2)

## 2021-09-30 MED ORDER — ACETAMINOPHEN 325 MG PO TABS
650.0000 mg | ORAL_TABLET | Freq: Once | ORAL | Status: AC
  Administered 2021-09-30: 650 mg via ORAL
  Filled 2021-09-30: qty 2

## 2021-09-30 NOTE — ED Triage Notes (Signed)
Pt here for continued dysuria since being dx w/ UTI and completing antibiotic course, and throbbing pain in penis since last night. Pt reports pelvic pain, nausea, but denies vomiting.

## 2021-10-01 ENCOUNTER — Ambulatory Visit (INDEPENDENT_AMBULATORY_CARE_PROVIDER_SITE_OTHER)
Admission: EM | Admit: 2021-10-01 | Discharge: 2021-10-01 | Disposition: A | Discharge status: Home / Self Care | Payer: BC Managed Care – PPO | Source: Home / Self Care

## 2021-10-01 ENCOUNTER — Encounter (HOSPITAL_COMMUNITY): Payer: Self-pay

## 2021-10-01 DIAGNOSIS — R300 Dysuria: Secondary | ICD-10-CM

## 2021-10-01 DIAGNOSIS — R102 Pelvic and perineal pain: Secondary | ICD-10-CM

## 2021-10-01 DIAGNOSIS — N3001 Acute cystitis with hematuria: Secondary | ICD-10-CM | POA: Diagnosis not present

## 2021-10-01 LAB — POCT URINALYSIS DIPSTICK, ED / UC
Bilirubin Urine: NEGATIVE
Glucose, UA: NEGATIVE mg/dL
Ketones, ur: NEGATIVE mg/dL
Nitrite: NEGATIVE
Protein, ur: 30 mg/dL — AB
Specific Gravity, Urine: 1.02 (ref 1.005–1.030)
Urobilinogen, UA: 1 mg/dL (ref 0.0–1.0)
pH: 5.5 (ref 5.0–8.0)

## 2021-10-01 MED ORDER — CIPROFLOXACIN HCL 500 MG PO TABS: 500.0000 mg | ORAL_TABLET | Freq: Two times a day (BID) | ORAL | 0 refills | Status: AC

## 2021-10-01 NOTE — ED Triage Notes (Signed)
Pt presents with UTI symptoms and pain during urination. Pt was seen at ER yesterday and left after triage. Pt had UA done yesterday and the nurse called and told him to come to [facility]

## 2021-10-01 NOTE — ED Provider Notes (Signed)
[place]   MRN: [id number] DOB: 06/18/1966  Subjective:   Daniel Briggs is a 55 y.o. male presenting for ongoing dysuria, pelvic discomfort. Had negative STI testing 09/12/2021. Urine culture showed no growth. CT showed bladder wall thickening, inflammatory versus infectious cystitis.  Has a history of epididymitis. Of note, patient did take Bactrim for suspected prostatitis.  Has not seen an urologist.  No current facility-administered medications for this encounter.  Current Outpatient Medications:    acetaminophen (TYLENOL) 500 MG tablet, Take 1,000 mg by mouth every 6 (six) hours as needed for moderate pain., Disp: , Rfl:    albuterol (VENTOLIN HFA) 108 (90 Base) MCG/ACT inhaler, Inhale 1-2 puffs into the lungs every 4 (four) hours as needed for shortness of breath or wheezing., Disp: , Rfl:    amLODipine (NORVASC) 5 MG tablet, Take 5 mg by mouth daily., Disp: , Rfl:    BREO ELLIPTA 200-25 MCG/INH AEPB, Inhale 1 puff into the lungs daily., Disp: , Rfl:    ciclopirox (PENLAC) 8 % solution, Apply 1 application topically daily as needed for rash., Disp: , Rfl:    hydrochlorothiazide (HYDRODIURIL) 12.5 MG tablet, Take 1 tablet (12.5 mg total) by mouth daily., Disp: 30 tablet, Rfl: 0   HYDROcodone-acetaminophen (NORCO/VICODIN) 5-325 MG tablet, Take 1-2 tablets by mouth every 6 (six) hours as needed., Disp: 6 tablet, Rfl: 0   hydrOXYzine (ATARAX/VISTARIL) 25 MG tablet, Take 1 tablet (25 mg total) by mouth every 6 (six) hours., Disp: 12 tablet, Rfl: 0   lidocaine (LIDODERM) 5 %, Place 1 patch onto the skin daily as needed. Apply patch to area most significant pain once per day.  Remove and discard patch within 12 hours of application., Disp: 30 patch, Rfl: 0   lisinopril-hydrochlorothiazide (PRINZIDE,ZESTORETIC) 20-25 MG tablet, Take 1 tablet by mouth daily., Disp: , Rfl:    meloxicam (MOBIC) 7.5 MG tablet, Take 1 tablet (7.5 mg total) by mouth daily., Disp: 30 tablet,  Rfl: 0   methocarbamol (ROBAXIN) 500 MG tablet, Take 500 mg by mouth 4 (four) times daily as needed for pain., Disp: , Rfl:    olmesartan-hydrochlorothiazide (BENICAR HCT) 40-25 MG tablet, Take 1 tablet by mouth daily., Disp: , Rfl:    potassium chloride SA (KLOR-CON) 20 MEQ tablet, Take 1 tablet (20 mEq total) by mouth daily., Disp: 3 tablet, Rfl: 0   predniSONE (STERAPRED UNI-PAK 21 TAB) 10 MG (21) TBPK tablet, Take as directed, Disp: 1 each, Rfl: 0   PROAIR RESPICLICK 123XX123 (90 Base) MCG/ACT AEPB, Inhale 1 puff into the lungs in the morning and at bedtime., Disp: , Rfl:    tamsulosin (FLOMAX) 0.4 MG CAPS capsule, Take 1 capsule (0.4 mg total) by mouth daily after supper., Disp: 30 capsule, Rfl: 2   triamcinolone cream (KENALOG) 0.1 %, Apply 1 application topically in the morning, at noon, and at bedtime., Disp: , Rfl:    Vitamin D, Ergocalciferol, (DRISDOL) 1.25 MG (50000 UNIT) CAPS capsule, Take 50,000 Units by mouth once a week., Disp: , Rfl:    Allergies  Allergen Reactions   Wellbutrin [Bupropion]     Suicidal thougths   Penicillins Hives    Has patient had a PCN reaction causing immediate rash, facial/tongue/throat swelling, SOB or lightheadedness with hypotension: Yes Has patient had a PCN reaction causing severe rash involving mucus membranes or skin necrosis: No Has patient had a PCN reaction that required hospitalization: No Has patient had a PCN reaction occurring within the last 10  years: No If all of the above answers are "NO", then may proceed with Cephalosporin use.     Past Medical History:  Diagnosis Date   Asthma    Hypertension      No past surgical history on file.  Family History  Problem Relation Age of Onset   Hypertension Mother    Diabetes Mother    Heart failure Mother    Heart failure Father     Social History   Tobacco Use   Smoking status: Every Day    Packs/day: 1.00    Types: Cigarettes   Smokeless tobacco: Never  Vaping Use   Vaping Use:  Never used  Substance Use Topics   Alcohol use: Not Currently    Alcohol/week: 3.0 standard drinks    Types: 3 Standard drinks or equivalent per week   Drug use: Yes    Frequency: 1.0 times per week    Types: Marijuana    ROS   Objective:   Vitals: BP (!) 148/90 (BP Location: Right Arm)   Pulse 92   Temp 99.1 F (37.3 C) (Oral)   Resp 18   Ht 5\' 8"  (1.727 m)   Wt 210 lb (95.3 kg)   SpO2 96%   BMI 31.93 kg/m   Physical Exam Constitutional:      General: He is not in acute distress.    Appearance: Normal appearance. He is well-developed and normal weight. He is not ill-appearing, toxic-appearing or diaphoretic.  HENT:     Head: Normocephalic and atraumatic.     Right Ear: External ear normal.     Left Ear: External ear normal.     Nose: Nose normal.     Mouth/Throat:     Pharynx: Oropharynx is clear.  Eyes:     General: No scleral icterus.       Right eye: No discharge.        Left eye: No discharge.     Extraocular Movements: Extraocular movements intact.     Pupils: Pupils are equal, round, and reactive to light.  Cardiovascular:     Rate and Rhythm: Normal rate.  Pulmonary:     Effort: Pulmonary effort is normal.  Abdominal:     General: Bowel sounds are normal. There is no distension.     Palpations: Abdomen is soft. There is no mass.     Tenderness: There is no abdominal tenderness. There is no right CVA tenderness, left CVA tenderness, guarding or rebound.  Musculoskeletal:     Cervical back: Normal range of motion.  Neurological:     Mental Status: He is alert and oriented to person, place, and time.  Psychiatric:        Mood and Affect: Mood normal.        Behavior: Behavior normal.        Thought Content: Thought content normal.        Judgment: Judgment normal.   Results for orders placed or performed during the hospital encounter of 10/01/21 (from the past 24 hour(s))  POC Urinalysis dipstick     Status: Abnormal   Collection Time: 10/01/21   5:20 PM  Result Value Ref Range   Glucose, UA NEGATIVE NEGATIVE mg/dL   Bilirubin Urine NEGATIVE NEGATIVE   Ketones, ur NEGATIVE NEGATIVE mg/dL   Specific Gravity, Urine 1.020 1.005 - 1.030   Hgb urine dipstick MODERATE (A) NEGATIVE   pH 5.5 5.0 - 8.0   Protein, ur 30 (A) NEGATIVE mg/dL   Urobilinogen, UA  1.0 0.0 - 1.0 mg/dL   Nitrite NEGATIVE NEGATIVE   Leukocytes,Ua LARGE (A) NEGATIVE     Results for orders placed or performed during the hospital encounter of 09/30/21 (from the past 24 hour(s))  Urinalysis, Routine w reflex microscopic Urine, Clean Catch     Status: Abnormal   Collection Time: 09/30/21  9:55 PM  Result Value Ref Range   Color, Urine AMBER (A) YELLOW   APPearance CLOUDY (A) CLEAR   Specific Gravity, Urine 1.024 1.005 - 1.030   pH 5.0 5.0 - 8.0   Glucose, UA NEGATIVE NEGATIVE mg/dL   Hgb urine dipstick MODERATE (A) NEGATIVE   Bilirubin Urine NEGATIVE NEGATIVE   Ketones, ur NEGATIVE NEGATIVE mg/dL   Protein, ur 993 (A) NEGATIVE mg/dL   Nitrite NEGATIVE NEGATIVE   Leukocytes,Ua LARGE (A) NEGATIVE   RBC / HPF 21-50 0 - 5 RBC/hpf   WBC, UA >50 (H) 0 - 5 WBC/hpf   Bacteria, UA FEW (A) NONE SEEN   Mucus PRESENT   Basic metabolic panel     Status: Abnormal   Collection Time: 09/30/21 10:03 PM  Result Value Ref Range   Sodium 139 135 - 145 mmol/L   Potassium 3.9 3.5 - 5.1 mmol/L   Chloride 107 98 - 111 mmol/L   CO2 25 22 - 32 mmol/L   Glucose, Bld 103 (H) 70 - 99 mg/dL   BUN 11 6 - 20 mg/dL   Creatinine, Ser 7.16 0.61 - 1.24 mg/dL   Calcium 9.0 8.9 - 96.7 mg/dL   GFR, Estimated >89 >38 mL/min   Anion gap 7 5 - 15  CBC     Status: Abnormal   Collection Time: 09/30/21 10:03 PM  Result Value Ref Range   WBC 16.7 (H) 4.0 - 10.5 K/uL   RBC 4.46 4.22 - 5.81 MIL/uL   Hemoglobin 13.9 13.0 - 17.0 g/dL   HCT 10.1 (L) 75.1 - 02.5 %   MCV 87.0 80.0 - 100.0 fL   MCH 31.2 26.0 - 34.0 pg   MCHC 35.8 30.0 - 36.0 g/dL   RDW 85.2 77.8 - 24.2 %   Platelets 400 150 -  400 K/uL   nRBC 0.0 0.0 - 0.2 %   CT Renal Stone Study  Result Date: 09/12/2021 CLINICAL DATA:  Hematuria, right lower quadrant pain. EXAM: CT ABDOMEN AND PELVIS WITHOUT CONTRAST TECHNIQUE: Multidetector CT imaging of the abdomen and pelvis was performed following the standard protocol without IV contrast. COMPARISON:  05/15/2012. FINDINGS: Lower chest: There is a subpleural nodule in the right middle lobe measuring 3 mm, axial image 3. A 4 mm subpleural nodule is noted in the left lower lobe, axial image 18. The heart is normal in size. Hepatobiliary: No focal liver abnormality is seen. No gallstones, gallbladder wall thickening, or biliary dilatation. Pancreas: Unremarkable. No pancreatic ductal dilatation or surrounding inflammatory changes. Spleen: Normal in size without focal abnormality. Adrenals/Urinary Tract: The adrenal glands are within normal limits. No renal calculus is seen bilaterally. No ureteral calculus or obstructive uropathy bilaterally. Parapelvic cysts are noted on the left. Bladder wall thickening is noted posteriorly with surrounding fat stranding. Stomach/Bowel: No bowel obstruction, free air or pneumatosis. A normal appendix is present in the right lower quadrant. Scattered diverticular present along the colon without evidence of diverticulitis. Vascular/Lymphatic: Aortic atherosclerosis. Nonspecific prominent retroperitoneal lymph nodes are noted along the aorta and iliac chains. Reproductive: The prostate gland is enlarged measuring 6 cm in diameter and surrounding fat stranding is noted. There  is an ill-defined low-attenuation region in the central prostate measuring 2.5 cm, possible prostatic abscess. Other: No free fluid. Musculoskeletal: Mild degenerative changes are present in the lower lumbar spine. No acute osseous abnormality is seen. IMPRESSION: 1. No renal or ureteral calculus or obstructive uropathy bilaterally. Stable left renal peripelvic cysts. 2. Mild bladder wall  thickening with surrounding fat stranding bilaterally, possible infectious or inflammatory cystitis. 3. The prostate gland is enlarged with surrounding fat stranding. There is a central region of low attenuation measuring 2.5 cm, possible prostatic abscess. 4. Stable right middle lobe and left lower lobe pulmonary nodules. Electronically Signed   By: Brett Fairy M.D.   On: 09/12/2021 23:52    Assessment and Plan :   PDMP not reviewed this encounter.  1. Acute cystitis with hematuria   2. Dysuria   3. Pelvic pain in male    Start ciprofloxacin to cover for acute cystitis, urine culture pending.  Needs to follow up with urology for further work up and rule out of prostatitis, may need extended course of cipro. Recommended aggressive hydration, limiting urinary irritants. Counseled patient on potential for adverse effects with medications prescribed/recommended today, ER and return-to-clinic precautions discussed, patient verbalized understanding.    Jaynee Eagles, PA-C 10/01/21 1733

## 2021-10-01 NOTE — ED Notes (Signed)
PT decided to leave . 

## 2021-10-02 LAB — URINE CULTURE

## 2021-10-04 LAB — URINE CULTURE: Culture: 10000 — AB

## 2021-10-05 ENCOUNTER — Telehealth: Payer: Self-pay

## 2021-10-05 NOTE — Telephone Encounter (Signed)
Post ED Visit - Positive Culture Follow-up: Unsuccessful Patient Follow-up  Culture assessed and recommendations reviewed by:  []  , Pharm.D. []  Enzo Bi, Pharm.D., BCPS AQ-ID []  , Pharm.D., BCPS []  Celedonio Miyamoto, Pharm.D., BCPS []  [place], Garvin Fila.D., BCPS, AAHIVP []  , Pharm.D., BCPS, AAHIVP []  Georgina Pillion, PharmD []  , PharmD, BCPS  Positive urine culture  [x]  Patient discharged without antimicrobial prescription and treatment is now indicated []  Organism is resistant to prescribed ED discharge antimicrobial []  Patient with positive blood cultures   Unable to contact patient after 3 attempts, letter will be sent to address on file  [place] 10/05/2021, 12:00 PM

## 2021-10-05 NOTE — Progress Notes (Signed)
ED Antimicrobial Stewardship Positive Culture Follow Up   Daniel Briggs is an 55 y.o. male who presented to [facility] on 09/30/2021 with a chief complaint of  Chief Complaint  Patient presents with   Dysuria    Recent Results (from the past 720 hour(s))  Urine Culture     Status: None   Collection Time: 09/12/21  3:41 PM   Specimen: Urine, Clean Catch  Result Value Ref Range Status   Specimen Description   Final    URINE, CLEAN CATCH Performed at [facility], [address] [place], [place] [address]    Special Requests   Final    NONE Performed at [facility], [address], [place], [place] [address]    Culture   Final    NO GROWTH Performed at [facility], [address], [place], [place] [address]    Report Status 09/14/2021 FINAL  Final  Urine Culture     Status: Abnormal   Collection Time: 09/30/21  9:56 PM   Specimen: Urine, Clean Catch  Result Value Ref Range Status   Specimen Description URINE, CLEAN CATCH  Final   Special Requests   Final    NONE Performed at [facility], [address] [place], [place] [address]    Culture (A)  Final    10,000 COLONIES/mL METHICILLIN RESISTANT STAPHYLOCOCCUS AUREUS   Report Status 10/04/2021 FINAL  Final   Organism ID, Bacteria METHICILLIN RESISTANT STAPHYLOCOCCUS AUREUS (A)  Final      Susceptibility   Methicillin resistant staphylococcus aureus - MIC*    CIPROFLOXACIN >=8 RESISTANT Resistant     GENTAMICIN <=0.5 SENSITIVE Sensitive     NITROFURANTOIN <=16 SENSITIVE Sensitive     OXACILLIN >=4 RESISTANT Resistant     TETRACYCLINE <=1 SENSITIVE Sensitive     VANCOMYCIN <=0.5 SENSITIVE Sensitive     TRIMETH/SULFA >=320 RESISTANT Resistant     CLINDAMYCIN <=0.25 SENSITIVE Sensitive     RIFAMPIN <=0.5 SENSITIVE Sensitive     Inducible Clindamycin NEGATIVE Sensitive     * 10,000 COLONIES/mL METHICILLIN RESISTANT STAPHYLOCOCCUS AUREUS  Urine Culture     Status:  Abnormal   Collection Time: 10/01/21  5:24 PM   Specimen: Urine, Clean Catch  Result Value Ref Range Status   Specimen Description URINE, CLEAN CATCH  Final   Special Requests   Final    NONE Performed at [facility], [address], [place], [place] [address]    Culture MULTIPLE SPECIES PRESENT, SUGGEST RECOLLECTION (A)  Final   Report Status 10/02/2021 FINAL  Final   Presented with ongoing dysuria and pelvic discomfort. Has recent negative STI and Ucx earlier in November - recently treated with Bactrim for suspected prostatitis.    [x]  Patient discharged originally without antimicrobial agent   Antibiotic plan: Despite low colony count given presented with ongoing urinary symptoms, will do wellness check to check on symptoms of UTI and/or if followed up with urology yet - if still symptomatic would order clindamycin 300 mg orally four times daily for 5 days and follow up with urology.   ED Provider: , PA  Evlyn Kanner, PharmD, BCCCP Clinical Pharmacist  10/05/2021, 11:28 AM Clinical Pharmacist Monday - Friday phone -  [phone] Saturday - Sunday phone - [phone]

## 2021-10-10 ENCOUNTER — Other Ambulatory Visit: Payer: Self-pay

## 2021-10-10 ENCOUNTER — Ambulatory Visit (HOSPITAL_COMMUNITY)
Admission: EM | Admit: 2021-10-10 | Discharge: 2021-10-10 | Disposition: A | Discharge status: Home / Self Care | Payer: BC Managed Care – PPO | Attending: Urgent Care | Admitting: Urgent Care

## 2021-10-10 ENCOUNTER — Encounter (HOSPITAL_COMMUNITY): Payer: Self-pay

## 2021-10-10 DIAGNOSIS — R1030 Lower abdominal pain, unspecified: Secondary | ICD-10-CM | POA: Insufficient documentation

## 2021-10-10 DIAGNOSIS — R300 Dysuria: Secondary | ICD-10-CM | POA: Diagnosis present

## 2021-10-10 LAB — POCT URINALYSIS DIPSTICK, ED / UC
Bilirubin Urine: NEGATIVE
Glucose, UA: NEGATIVE mg/dL
Ketones, ur: NEGATIVE mg/dL
Nitrite: NEGATIVE
Protein, ur: 100 mg/dL — AB
Specific Gravity, Urine: 1.02 (ref 1.005–1.030)
Urobilinogen, UA: 0.2 mg/dL (ref 0.0–1.0)
pH: 5.5 (ref 5.0–8.0)

## 2021-10-10 MED ORDER — MELOXICAM 7.5 MG PO TABS: 7.5000 mg | ORAL_TABLET | Freq: Every day | ORAL | 0 refills | Status: AC

## 2021-10-10 NOTE — Discharge Instructions (Addendum)
We will let you know what your results are and if you still need any kind of antibiotics.

## 2021-10-10 NOTE — ED Provider Notes (Signed)
[facility]   MRN: [id number] DOB: 24-Oct-1966  Subjective:   Daniel Briggs is a 55 y.o. male presenting for recheck on his urinary symptoms.  Patient continues to have dysuria, lower abdominal pain.  He is concerned because he seen some cloudy discharge in the urine and would like to be checked again for STIs.  He did take the ciprofloxacin and had some improvement but toward the end that his symptoms started to return.  He has been in contact with an urologist, Dr. Earlene Plater, with [facility].  He supposed to get an appointment with them over the next couple of weeks.  Has been trying to get a referral from our clinic.  No current facility-administered medications for this encounter.  Current Outpatient Medications:    acetaminophen (TYLENOL) 500 MG tablet, Take 1,000 mg by mouth every 6 (six) hours as needed for moderate pain., Disp: , Rfl:    albuterol (VENTOLIN HFA) 108 (90 Base) MCG/ACT inhaler, Inhale 1-2 puffs into the lungs every 4 (four) hours as needed for shortness of breath or wheezing., Disp: , Rfl:    amLODipine (NORVASC) 5 MG tablet, Take 5 mg by mouth daily., Disp: , Rfl:    BREO ELLIPTA 200-25 MCG/INH AEPB, Inhale 1 puff into the lungs daily., Disp: , Rfl:    ciclopirox (PENLAC) 8 % solution, Apply 1 application topically daily as needed for rash., Disp: , Rfl:    ciprofloxacin (CIPRO) 500 MG tablet, Take 1 tablet (500 mg total) by mouth 2 (two) times daily., Disp: 20 tablet, Rfl: 0   hydrochlorothiazide (HYDRODIURIL) 12.5 MG tablet, Take 1 tablet (12.5 mg total) by mouth daily., Disp: 30 tablet, Rfl: 0   HYDROcodone-acetaminophen (NORCO/VICODIN) 5-325 MG tablet, Take 1-2 tablets by mouth every 6 (six) hours as needed., Disp: 6 tablet, Rfl: 0   hydrOXYzine (ATARAX/VISTARIL) 25 MG tablet, Take 1 tablet (25 mg total) by mouth every 6 (six) hours., Disp: 12 tablet, Rfl: 0   lidocaine (LIDODERM) 5 %, Place 1 patch onto the skin daily as needed. Apply patch  to area most significant pain once per day.  Remove and discard patch within 12 hours of application., Disp: 30 patch, Rfl: 0   lisinopril-hydrochlorothiazide (PRINZIDE,ZESTORETIC) 20-25 MG tablet, Take 1 tablet by mouth daily., Disp: , Rfl:    meloxicam (MOBIC) 7.5 MG tablet, Take 1 tablet (7.5 mg total) by mouth daily., Disp: 30 tablet, Rfl: 0   methocarbamol (ROBAXIN) 500 MG tablet, Take 500 mg by mouth 4 (four) times daily as needed for pain., Disp: , Rfl:    olmesartan-hydrochlorothiazide (BENICAR HCT) 40-25 MG tablet, Take 1 tablet by mouth daily., Disp: , Rfl:    potassium chloride SA (KLOR-CON) 20 MEQ tablet, Take 1 tablet (20 mEq total) by mouth daily., Disp: 3 tablet, Rfl: 0   predniSONE (STERAPRED UNI-PAK 21 TAB) 10 MG (21) TBPK tablet, Take as directed, Disp: 1 each, Rfl: 0   PROAIR RESPICLICK 108 (90 Base) MCG/ACT AEPB, Inhale 1 puff into the lungs in the morning and at bedtime., Disp: , Rfl:    tamsulosin (FLOMAX) 0.4 MG CAPS capsule, Take 1 capsule (0.4 mg total) by mouth daily after supper., Disp: 30 capsule, Rfl: 2   triamcinolone cream (KENALOG) 0.1 %, Apply 1 application topically in the morning, at noon, and at bedtime., Disp: , Rfl:    Vitamin D, Ergocalciferol, (DRISDOL) 1.25 MG (50000 UNIT) CAPS capsule, Take 50,000 Units by mouth once a week., Disp: , Rfl:  Allergies  Allergen Reactions   Wellbutrin [Bupropion]     Suicidal thougths   Penicillins Hives    Has patient had a PCN reaction causing immediate rash, facial/tongue/throat swelling, SOB or lightheadedness with hypotension: Yes Has patient had a PCN reaction causing severe rash involving mucus membranes or skin necrosis: No Has patient had a PCN reaction that required hospitalization: No Has patient had a PCN reaction occurring within the last 10 years: No If all of the above answers are "NO", then may proceed with Cephalosporin use.     Past Medical History:  Diagnosis Date   Asthma    Hypertension       History reviewed. No pertinent surgical history.  Family History  Problem Relation Age of Onset   Hypertension Mother    Diabetes Mother    Heart failure Mother    Heart failure Father     Social History   Tobacco Use   Smoking status: Every Day    Packs/day: 0.50    Types: Cigarettes   Smokeless tobacco: Never  Vaping Use   Vaping Use: Never used  Substance Use Topics   Alcohol use: Not Currently    Alcohol/week: 3.0 standard drinks    Types: 3 Standard drinks or equivalent per week   Drug use: Yes    Frequency: 1.0 times per week    Types: Marijuana    Comment: daily    ROS   Objective:   Vitals: BP (!) 143/74 (BP Location: Left Arm)   Pulse 93   Temp 99.3 F (37.4 C) (Oral)   Resp 19   SpO2 98%   Physical Exam Constitutional:      General: He is not in acute distress.    Appearance: Normal appearance. He is well-developed and normal weight. He is not ill-appearing, toxic-appearing or diaphoretic.  HENT:     Head: Normocephalic and atraumatic.     Right Ear: External ear normal.     Left Ear: External ear normal.     Nose: Nose normal.     Mouth/Throat:     Pharynx: Oropharynx is clear.  Eyes:     General: No scleral icterus.       Right eye: No discharge.        Left eye: No discharge.     Extraocular Movements: Extraocular movements intact.     Pupils: Pupils are equal, round, and reactive to light.  Cardiovascular:     Rate and Rhythm: Normal rate.  Pulmonary:     Effort: Pulmonary effort is normal.  Abdominal:     General: Bowel sounds are normal. There is no distension.     Palpations: Abdomen is soft. There is no mass.     Tenderness: There is no abdominal tenderness. There is no right CVA tenderness, left CVA tenderness, guarding or rebound.  Genitourinary:    Penis: Circumcised. No phimosis, paraphimosis, hypospadias, erythema, tenderness, discharge, swelling or lesions.   Musculoskeletal:     Cervical back: Normal range of motion.   Neurological:     Mental Status: He is alert and oriented to person, place, and time.  Psychiatric:        Mood and Affect: Mood normal.        Behavior: Behavior normal.        Thought Content: Thought content normal.        Judgment: Judgment normal.    Results for orders placed or performed during the hospital encounter of 10/10/21 (from the past 24  hour(s))  POCT Urinalysis Dipstick (ED/UC)     Status: Abnormal   Collection Time: 10/10/21  3:33 PM  Result Value Ref Range   Glucose, UA NEGATIVE NEGATIVE mg/dL   Bilirubin Urine NEGATIVE NEGATIVE   Ketones, ur NEGATIVE NEGATIVE mg/dL   Specific Gravity, Urine 1.020 1.005 - 1.030   Hgb urine dipstick MODERATE (A) NEGATIVE   pH 5.5 5.0 - 8.0   Protein, ur 100 (A) NEGATIVE mg/dL   Urobilinogen, UA 0.2 0.0 - 1.0 mg/dL   Nitrite NEGATIVE NEGATIVE   Leukocytes,Ua LARGE (A) NEGATIVE    Assessment and Plan :   PDMP not reviewed this encounter.  1. Dysuria   2. Lower abdominal pain    We will hold off on further antibiotic use.  Urine culture was equivocal from last office visit.  Recommended close follow-up with Dr. Earlene Plater with [facility].  Unfortunately I am not able to do a referral as we are in urgent care clinic.  I discussed this with patient and he verbalized understanding.  He will try to contact Dr. Earlene Plater for an appointment. Counseled patient on potential for adverse effects with medications prescribed/recommended today, ER and return-to-clinic precautions discussed, patient verbalized understanding.    Wallis Bamberg, [place] 10/10/21 1623

## 2021-10-10 NOTE — ED Triage Notes (Signed)
Pt reports lower abdominal pain and burning when urinating x 1 month; milky discharge when urinating. Reports he finished antibiotic for UTI without relief.

## 2021-10-11 LAB — CYTOLOGY, (ORAL, ANAL, URETHRAL) ANCILLARY ONLY
Chlamydia: NEGATIVE
Comment: NEGATIVE
Comment: NEGATIVE
Comment: NORMAL
Neisseria Gonorrhea: NEGATIVE
Trichomonas: NEGATIVE

## 2021-10-13 ENCOUNTER — Telehealth (HOSPITAL_COMMUNITY): Payer: Self-pay | Admitting: Emergency Medicine

## 2021-10-13 LAB — URINE CULTURE: Culture: 10000 — AB

## 2021-10-13 MED ORDER — CLINDAMYCIN HCL 150 MG PO CAPS: 300.0000 mg | ORAL_CAPSULE | Freq: Three times a day (TID) | ORAL | 0 refills | Status: AC
